# Patient Record
Sex: Female | Born: 1990 | Race: Black or African American | Hispanic: No | Marital: Single | Smoking: Current every day smoker
Health system: Southern US, Community
[De-identification: ages and names within clinical notes are randomized; demographics above are authoritative.]

## PROBLEM LIST (undated history)

## (undated) DIAGNOSIS — Z789 Other specified health status: Secondary | ICD-10-CM

## (undated) HISTORY — PX: NO PAST SURGERIES: SHX2092

## (undated) HISTORY — PX: DENTAL SURGERY: SHX609

---
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
## 2009-05-26 ENCOUNTER — Encounter: Payer: Self-pay | Admitting: Family Medicine

## 2009-06-01 ENCOUNTER — Encounter: Payer: Self-pay | Admitting: Family Medicine

## 2009-08-21 ENCOUNTER — Observation Stay: Payer: Self-pay | Admitting: Obstetrics and Gynecology

## 2009-09-02 ENCOUNTER — Observation Stay: Payer: Self-pay | Admitting: Obstetrics and Gynecology

## 2009-09-10 ENCOUNTER — Inpatient Hospital Stay: Payer: Self-pay

## 2009-09-21 ENCOUNTER — Emergency Department: Payer: Self-pay | Admitting: Emergency Medicine

## 2011-05-15 ENCOUNTER — Emergency Department: Payer: Self-pay | Admitting: Emergency Medicine

## 2011-08-11 ENCOUNTER — Emergency Department: Payer: Self-pay | Admitting: Emergency Medicine

## 2012-10-06 ENCOUNTER — Emergency Department: Payer: Self-pay | Admitting: Emergency Medicine

## 2012-10-06 LAB — URINALYSIS, COMPLETE
Bacteria: NONE SEEN
Bilirubin,UR: NEGATIVE
Glucose,UR: NEGATIVE mg/dL (ref 0–75)
Ketone: NEGATIVE
Nitrite: NEGATIVE
Ph: 5 (ref 4.5–8.0)
Protein: 30
RBC,UR: 109 /HPF (ref 0–5)
Specific Gravity: 1.032 (ref 1.003–1.030)
Squamous Epithelial: 11
WBC UR: 13 /HPF (ref 0–5)

## 2012-10-06 LAB — CBC
HCT: 37.7 % (ref 35.0–47.0)
HGB: 13 g/dL (ref 12.0–16.0)
MCH: 28.5 pg (ref 26.0–34.0)
MCHC: 34.5 g/dL (ref 32.0–36.0)
MCV: 83 fL (ref 80–100)
Platelet: 246 10*3/uL (ref 150–440)
RBC: 4.56 10*6/uL (ref 3.80–5.20)
RDW: 12.9 % (ref 11.5–14.5)
WBC: 10.5 10*3/uL (ref 3.6–11.0)

## 2012-10-06 LAB — HCG, QUANTITATIVE, PREGNANCY: Beta Hcg, Quant.: 10453 m[IU]/mL — ABNORMAL HIGH

## 2012-10-07 ENCOUNTER — Emergency Department: Payer: Self-pay | Admitting: Emergency Medicine

## 2012-10-07 LAB — CBC
HCT: 35.9 % (ref 35.0–47.0)
HGB: 12.5 g/dL (ref 12.0–16.0)
MCH: 28.6 pg (ref 26.0–34.0)
MCHC: 34.7 g/dL (ref 32.0–36.0)
MCV: 82 fL (ref 80–100)
Platelet: 238 10*3/uL (ref 150–440)
RBC: 4.36 10*6/uL (ref 3.80–5.20)
RDW: 12.7 % (ref 11.5–14.5)
WBC: 10.4 10*3/uL (ref 3.6–11.0)

## 2012-10-07 LAB — HCG, QUANTITATIVE, PREGNANCY: Beta Hcg, Quant.: 7074 m[IU]/mL — ABNORMAL HIGH

## 2013-07-31 ENCOUNTER — Emergency Department: Payer: Self-pay | Admitting: Emergency Medicine

## 2013-12-26 ENCOUNTER — Emergency Department: Payer: Self-pay | Admitting: Internal Medicine

## 2013-12-30 ENCOUNTER — Emergency Department: Payer: Self-pay | Admitting: Emergency Medicine

## 2014-08-31 ENCOUNTER — Emergency Department: Payer: Self-pay | Admitting: Emergency Medicine

## 2014-10-07 ENCOUNTER — Emergency Department: Payer: Self-pay | Admitting: Emergency Medicine

## 2014-12-18 ENCOUNTER — Emergency Department: Payer: Self-pay | Admitting: Emergency Medicine

## 2015-03-04 ENCOUNTER — Emergency Department
Admission: EM | Admit: 2015-03-04 | Discharge: 2015-03-04 | Disposition: A | Discharge status: Home / Self Care | Payer: Medicaid Other | Attending: Emergency Medicine | Admitting: Emergency Medicine

## 2015-03-04 ENCOUNTER — Encounter: Payer: Self-pay | Admitting: *Deleted

## 2015-03-04 DIAGNOSIS — M62838 Other muscle spasm: Secondary | ICD-10-CM | POA: Diagnosis not present

## 2015-03-04 DIAGNOSIS — Z720 Tobacco use: Secondary | ICD-10-CM | POA: Diagnosis not present

## 2015-03-04 DIAGNOSIS — R202 Paresthesia of skin: Secondary | ICD-10-CM | POA: Diagnosis not present

## 2015-03-04 DIAGNOSIS — M79602 Pain in left arm: Secondary | ICD-10-CM | POA: Diagnosis present

## 2015-03-04 MED ORDER — PREDNISONE 20 MG PO TABS
60.0000 mg | ORAL_TABLET | Freq: Once | ORAL | Status: AC
  Administered 2015-03-04: 60 mg via ORAL

## 2015-03-04 MED ORDER — PREDNISONE 20 MG PO TABS
ORAL_TABLET | ORAL | Status: DC
Start: 2015-03-04 — End: 2015-03-05
  Filled 2015-03-04: qty 3

## 2015-03-04 MED ORDER — PREDNISONE 10 MG (21) PO TBPK: ORAL_TABLET | ORAL | Status: DC

## 2015-03-04 MED ORDER — CYCLOBENZAPRINE HCL 10 MG PO TABS
ORAL_TABLET | ORAL | Status: AC
  Filled 2015-03-04: qty 1

## 2015-03-04 MED ORDER — CYCLOBENZAPRINE HCL 10 MG PO TABS: 10.0000 mg | ORAL_TABLET | Freq: Three times a day (TID) | ORAL | Status: AC | PRN

## 2015-03-04 MED ORDER — CYCLOBENZAPRINE HCL 10 MG PO TABS
5.0000 mg | ORAL_TABLET | Freq: Once | ORAL | Status: AC
  Administered 2015-03-04: 5 mg via ORAL

## 2015-03-04 NOTE — ED Provider Notes (Signed)
[facility] Emergency Department Provider Note    ____________________________________________  Time seen: 2045  I have reviewed the triage vital signs and the nursing notes.   HISTORY  Chief Complaint Arm Injury       HPI Mary Matthews is a 24 y.o. female numbness and tingling intermittently down her left arm particularly after work so she thought she slept longer pulled something is just here for evaluation starts in the shoulder goes down her left arm comes and goes after started 2 days ago denies any pain nothing makes it better or worse and no other symptoms currently no chest pain shortness of breath or any other neurological deficits     No past medical history on file.  There are no active problems to display for this patient.   No past surgical history on file.  Current Outpatient Rx  Name  Route  Sig  Dispense  Refill  . cyclobenzaprine (FLEXERIL) 10 MG tablet   Oral   Take 1 tablet (10 mg total) by mouth every 8 (eight) hours as needed for muscle spasms.   12 tablet   1   . predniSONE (STERAPRED UNI-PAK 21 TAB) 10 MG (21) TBPK tablet      Take 6 tablets on day 1 Take 5 tablets on day 2 Take 4 tablets on day 3 Take 3 tablets on day 4 Take 2 tablets on day 5 Take 1 tablet on day 6   21 tablet   0     Allergies Review of patient's allergies indicates no known allergies.  No family history on file.  Social History History  Substance Use Topics  . Smoking status: Current Every Day Smoker  . Smokeless tobacco: Not on file  . Alcohol Use: Yes    Review of Systems  Constitutional: Negative for fever. Eyes: Negative for visual changes. ENT: Negative for sore throat. Cardiovascular: Negative for chest pain. Respiratory: Negative for shortness of breath. Gastrointestinal: Negative for abdominal pain, vomiting and diarrhea. Genitourinary: Negative for dysuria. Musculoskeletal: Negative for back pain. Skin: Negative  for rash. Neurological: Negative for headaches, focal weakness or numbness.   10-point ROS otherwise negative.  ____________________________________________   PHYSICAL EXAM:  VITAL SIGNS: ED Triage Vitals  Enc Vitals Group     BP 03/04/15 1932 126/67 mmHg     Pulse Rate 03/04/15 1932 78     Resp 03/04/15 1932 17     Temp 03/04/15 1932 98.2 F (36.8 C)     Temp Source 03/04/15 1932 Oral     SpO2 03/04/15 1932 100 %     Weight 03/04/15 1932 185 lb (83.915 kg)     Height 03/04/15 1932 5\' 4"  (1.626 m)     Head Cir --      Peak Flow --      Pain Score 03/04/15 1933 8     Pain Loc --      Pain Edu? --      Excl. in GC? --      Constitutional: Alert and oriented. Well appearing and in no distress. Eyes: Conjunctivae are normal. PERRL. Normal extraocular movements. ENT   Head: Normocephalic and atraumatic.   Nose: No congestion/rhinnorhea.   Mouth/Throat: Mucous membranes are moist.   Neck: No stridor. Hematological/Lymphatic/Immunilogical: No cervical lymphadenopathy. Cardiovascular: Normal rate, regular rhythm. Normal and symmetric distal pulses are present in all extremities. No murmurs, rubs, or gallops. Respiratory: Normal respiratory effort without tachypnea nor retractions. Breath sounds are clear and equal bilaterally.  No wheezes/rales/rhonchi.  Musculoskeletal: Nontender with normal range of motion in all extremities. No joint effusions.  No lower extremity tenderness nor edema. Neurologic:  Normal speech and language. No gross focal neurologic deficits are appreciated. Speech is normal. No gait instability. Skin:  Skin is warm, dry and intact. No rash noted. Psychiatric: Mood and affect are normal. Speech and behavior are normal. Patient exhibits appropriate insight and judgment.  ____________________________________________       PROCEDURES  Procedure(s) performed: None  Critical Care performed:  No  ____________________________________________   INITIAL IMPRESSION / ASSESSMENT AND PLAN / ED COURSE  Pertinent labs & imaging results that were available during my care of the patient were reviewed by me and considered in my medical decision making (see chart for details).  Muscle spasm paresthesia will start patient on muscle relaxers and anti-inflammatories have a follow-up with orthopedics if symptoms persist return here for any acute concerns or worsening symptoms patient had an otherwise normal exam  ____________________________________________   FINAL CLINICAL IMPRESSION(S) / ED DIAGNOSES  Final diagnoses:  Paresthesia  Muscle spasm    Chinara Hertzberg Rosalyn Gess, PA-C 03/04/15 2111  Loleta Rose, MD 03/04/15 2330

## 2015-03-04 NOTE — ED Notes (Signed)
Pt with left upper arm numbness x 2 days. Pt states she thought she slept on it wrong but it does not seem to be getting better.

## 2015-11-20 ENCOUNTER — Emergency Department
Admission: EM | Admit: 2015-11-20 | Discharge: 2015-11-20 | Disposition: A | Discharge status: Home / Self Care | Payer: Self-pay | Attending: Student | Admitting: Student

## 2015-11-20 DIAGNOSIS — K029 Dental caries, unspecified: Secondary | ICD-10-CM | POA: Insufficient documentation

## 2015-11-20 DIAGNOSIS — F17200 Nicotine dependence, unspecified, uncomplicated: Secondary | ICD-10-CM | POA: Insufficient documentation

## 2015-11-20 DIAGNOSIS — K0889 Other specified disorders of teeth and supporting structures: Secondary | ICD-10-CM | POA: Insufficient documentation

## 2015-11-20 MED ORDER — AMOXICILLIN 500 MG PO CAPS: 500.0000 mg | ORAL_CAPSULE | Freq: Three times a day (TID) | ORAL | Status: DC

## 2015-11-20 MED ORDER — IBUPROFEN 800 MG PO TABS
800.0000 mg | ORAL_TABLET | Freq: Once | ORAL | Status: AC
Start: 2015-11-20 — End: 2015-11-20
  Administered 2015-11-20: 800 mg via ORAL
  Filled 2015-11-20: qty 1

## 2015-11-20 MED ORDER — TRAMADOL HCL 50 MG PO TABS: 50.0000 mg | ORAL_TABLET | Freq: Four times a day (QID) | ORAL | Status: DC | PRN

## 2015-11-20 MED ORDER — IBUPROFEN 800 MG PO TABS: 800.0000 mg | ORAL_TABLET | Freq: Three times a day (TID) | ORAL | Status: DC | PRN

## 2015-11-20 MED ORDER — TRAMADOL HCL 50 MG PO TABS
50.0000 mg | ORAL_TABLET | Freq: Once | ORAL | Status: AC
  Administered 2015-11-20: 50 mg via ORAL
  Filled 2015-11-20: qty 1

## 2015-11-20 MED ORDER — LIDOCAINE VISCOUS 2 % MT SOLN
15.0000 mL | Freq: Once | OROMUCOSAL | Status: AC
  Administered 2015-11-20: 15 mL via OROMUCOSAL
  Filled 2015-11-20: qty 15

## 2015-11-20 NOTE — ED Notes (Signed)
Pt in with co toothache

## 2015-11-20 NOTE — ED Notes (Signed)
AAOx3.  Skin warm and dry.  NAD 

## 2015-11-20 NOTE — Discharge Instructions (Signed)
Follow-up from the list of dental clinics provided. °OPTIONS FOR DENTAL FOLLOW UP CARE ° °[place] Department of Health and Human Services - Local Safety Net Dental Clinics °http://www.ncdhhs.gov/dph/oralhealth/services/safetynetclinics.htm °  °[facility] (336-562-3123) ° °[facility] [place] ([phone]) ° °[facility] ([phone] ext 237) ° °[facility] ([phone]) ° °[facility] ([phone]) °This clinic caters to the indigent population and is on a lottery system. °Location: °[facility], [address], [place] °Clinic Hours: °Wednesdays from 6pm - 9pm, patients seen by a lottery system. °For dates, call or go to [email or web address].[facility].edu/[facility]/patients/Dental-[facility] °Services: °Cleanings, fillings and simple extractions. °Payment Options: °DENTAL WORK IS FREE OF CHARGE. Bring proof of income or support. °Best way to get seen: °Arrive at 5:15 pm - this is a lottery, NOT first come/first serve, so arriving earlier will not increase your chances of being seen. °  °  °[facility] °[phone] °Select option 1 for emergencies °  °Location: °[facility], [address], [place] °Clinic Hours: °No walk-ins accepted - call the day before to schedule an appointment. °Check in times are 9:30 am and 1:30 pm. °Services: °Simple extractions, temporary fillings, pulpectomy/pulp debridement, uncomplicated abscess drainage. °Payment Options: °PAYMENT IS DUE AT THE TIME OF SERVICE.  Fee is usually $100-200, additional surgical procedures (e.g. abscess drainage) may be extra. °Cash, checks, Visa/MasterCard accepted.  Can file Medicaid if patient is covered for dental - patient should call case worker to check. °No discount for [facility] patients. °Best way to get seen: °MUST call the day before and get onto the schedule. Can usually be seen the next 1-2 days. No walk-ins accepted. °  °   °[facility] °[phone] °  °Location: °[facility], [address], [place] °Clinic Hours: °M, W, Th, F 8am or 1:30pm, Tues 9a or 1:30 - first come/first served. °Services: °Simple extractions, temporary fillings, uncomplicated abscess drainage.  You do not need to be an [place] resident. °Payment Options: °PAYMENT IS DUE AT THE TIME OF SERVICE. °Dental insurance, otherwise sliding scale - bring proof of income or support. °Depending on income and treatment needed, cost is usually $50-200. °Best way to get seen: °Arrive early as it is first come/first served. °  °  °[facility] °[phone] °  °Location: °[address] °Clinic Hours: °Mon-Thu 8a-5p °Services: °Most basic dental services including extractions and fillings. °Payment Options: °PAYMENT IS DUE AT THE TIME OF SERVICE. °Sliding scale, up to 50% off - bring proof if income or support. °Medicaid with dental option accepted. °Best way to get seen: °Call to schedule an appointment, can usually be seen within 2 weeks OR they will try to see walk-ins - show up at 8a or 2p (you may have to wait). °  °  °[facility] °[phone] °[place] RESIDENTS ONLY °  °Location: °[facility], [address], [place], [place] [address] °Clinic Hours: By appointment only. °Monday - Thursday 8am-5pm, Friday 8am-12pm °Services: Cleanings, fillings, extractions. °Payment Options: °PAYMENT IS DUE AT THE TIME OF SERVICE. °Cash, Visa or MasterCard. Sliding scale - $30 minimum per service. °Best way to get seen: °Come in to office, complete packet and make an appointment - need proof of income °or support monies for each household member and proof of [place] residence. °Usually takes about a month to get in. °  °  °[facility] °[phone] °  °Location: °[address] [place] °Clinic Hours: Walk-in Urgent Care  Dental Services are offered Monday-Friday mornings only. °The numbers of emergencies accepted daily is limited to the number of °providers available. °Maximum 15 - Mondays, Wednesdays & Thursdays °Maximum 10 - Tuesdays & Fridays °Services: °You do not need to be a [place] resident to be seen for a dental emergency. °Emergencies are defined as pain, swelling, abnormal bleeding, or dental trauma. Walkins will receive x-rays if needed. °NOTE: Dental cleaning is not an emergency. °Payment Options: °PAYMENT IS DUE AT THE TIME OF SERVICE. °Minimum co-pay is $40.00 for uninsured patients. °Minimum co-pay is $3.00 for Medicaid with dental coverage. °Dental Insurance is accepted and must be presented at time of visit. °Medicare does not cover dental. °Forms of payment: Cash, credit card, checks. °Best way to get seen: °If not previously registered with the clinic, walk-in dental registration begins at 7:15 am and is on a first come/first serve basis. °If previously registered with the clinic, call to make an appointment. °  °  °[facility] °[phone] °[place] RESIDENTS ONLY °  °Location: °[address], [place], [place] °Clinic Hours: °Mon-Thu 10a-2p °Services: Extractions only! °Payment Options: °FREE (donations accepted) - bring proof of income or support °Best way to get seen: °Call and schedule an appointment OR come at 8am on the 1st Monday of every month (except for holidays) when it is first come/first served. °  °  °[facility] °[phone] °  °Location: °[address], [place] °Clinic Hours: °Friday mornings °Services, Payment Options, Best way to get seen: °Call for info ° °

## 2015-11-20 NOTE — ED Provider Notes (Signed)
[facility] Emergency Department Provider Note  ____________________________________________  Time seen: Approximately 9:54 PM  I have reviewed the triage vital signs and the nursing notes.   HISTORY  Chief Complaint Dental Pain    HPI Mary Matthews is a 25 y.o. female patient complaining of onset of dental pain approximately 2 hours ago. Patient state filling in the left upper molar fell out while chewing gum. Patient denies any fever or swelling secondary to his complaint. Patient is rating the pain as a 10 over 10. Patient is describing the pain as sharp. No palliative measures taken for this complaint.   No past medical history on file.  There are no active problems to display for this patient.   No past surgical history on file.  Current Outpatient Rx  Name  Route  Sig  Dispense  Refill  . amoxicillin (AMOXIL) 500 MG capsule   Oral   Take 1 capsule (500 mg total) by mouth 3 (three) times daily.   30 capsule   0   . cyclobenzaprine (FLEXERIL) 10 MG tablet   Oral   Take 1 tablet (10 mg total) by mouth every 8 (eight) hours as needed for muscle spasms.   12 tablet   1   . ibuprofen (ADVIL,MOTRIN) 800 MG tablet   Oral   Take 1 tablet (800 mg total) by mouth every 8 (eight) hours as needed.   30 tablet   0   . predniSONE (STERAPRED UNI-PAK 21 TAB) 10 MG (21) TBPK tablet      Take 6 tablets on day 1 Take 5 tablets on day 2 Take 4 tablets on day 3 Take 3 tablets on day 4 Take 2 tablets on day 5 Take 1 tablet on day 6   21 tablet   0   . traMADol (ULTRAM) 50 MG tablet   Oral   Take 1 tablet (50 mg total) by mouth every 6 (six) hours as needed.   20 tablet   0     Allergies Review of patient's allergies indicates no known allergies.  No family history on file.  Social History Social History  Substance Use Topics  . Smoking status: Current Every Day Smoker  . Smokeless tobacco: Not on file  . Alcohol Use: Yes    Review  of Systems Constitutional: No fever/chills Eyes: No visual changes. ENT: No sore throat. Dental painCardiovascular: Denies chest pain. Respiratory: Denies shortness of breath. Gastrointestinal: No abdominal pain.  No nausea, no vomiting.  No diarrhea.  No constipation. Genitourinary: Negative for dysuria. Musculoskeletal: Negative for back pain. Skin: Negative for rash. Neurological: Negative for headaches, focal weakness or numbness. 10-point ROS otherwise negative.  ____________________________________________   PHYSICAL EXAM:  VITAL SIGNS: ED Triage Vitals  Enc Vitals Group     BP 11/20/15 2114 110/95 mmHg     Pulse Rate 11/20/15 2114 87     Resp 11/20/15 2114 20     Temp 11/20/15 2114 98.3 F (36.8 C)     Temp Source 11/20/15 2114 Oral     SpO2 11/20/15 2114 98 %     Weight 11/20/15 2114 183 lb (83.008 kg)     Height 11/20/15 2114  (1.651 m)     Head Cir --      Peak Flow --      Pain Score 11/20/15 2117 10     Pain Loc --      Pain Edu? --      Excl. in  GC? --     Constitutional: Alert and oriented. Well appearing and in no acute distress. Eyes: Conjunctivae are normal. PERRL. EOMI. Head: Atraumatic. Nose: No congestion/rhinnorhea. Mouth/Throat: Mucous membranes are moist.  Oropharynx non-erythematous. Cavity tooth #14 Neck: No stridor.  No cervical spine tenderness to palpation. Hematological/Lymphatic/Immunilogical: No cervical lymphadenopathy. Cardiovascular: Normal rate, regular rhythm. Grossly normal heart sounds.  Good peripheral circulation. Respiratory: Normal respiratory effort.  No retractions. Lungs CTAB. Gastrointestinal: Soft and nontender. No distention. No abdominal bruits. No CVA tenderness. Musculoskeletal: No lower extremity tenderness nor edema.  No joint effusions. Neurologic:  Normal speech and language. No gross focal neurologic deficits are appreciated. No gait instability. Skin:  Skin is warm, dry and intact. No rash  noted. Psychiatric: Mood and affect are normal. Speech and behavior are normal.  ____________________________________________   LABS (all labs ordered are listed, but only abnormal results are displayed)  Labs Reviewed - No data to display ____________________________________________  EKG   ____________________________________________  RADIOLOGY   ____________________________________________   PROCEDURES  Procedure(s) performed: None  Critical Care performed: No  ____________________________________________   INITIAL IMPRESSION / ASSESSMENT AND PLAN / ED COURSE  Pertinent labs & imaging results that were available during my care of the patient were reviewed by me and considered in my medical decision making (see chart for details).  The pain secondary to cavity. Patient given a list of dental clinics for follow-up care. Patient given a prescription for amoxicillin, ibuprofen, tramadol. ____________________________________________   FINAL CLINICAL IMPRESSION(S) / ED DIAGNOSES  Final diagnoses:  Pain due to dental caries      Joni Reining, PA-C 11/20/15 2204  Gayla Doss, MD 11/22/15 [phone]

## 2016-04-19 ENCOUNTER — Emergency Department
Admission: EM | Admit: 2016-04-19 | Discharge: 2016-04-19 | Disposition: A | Discharge status: Home / Self Care | Payer: Medicaid Other | Attending: Emergency Medicine | Admitting: Emergency Medicine

## 2016-04-19 DIAGNOSIS — F17200 Nicotine dependence, unspecified, uncomplicated: Secondary | ICD-10-CM | POA: Diagnosis not present

## 2016-04-19 DIAGNOSIS — K0889 Other specified disorders of teeth and supporting structures: Secondary | ICD-10-CM

## 2016-04-19 DIAGNOSIS — J039 Acute tonsillitis, unspecified: Secondary | ICD-10-CM | POA: Diagnosis not present

## 2016-04-19 DIAGNOSIS — J029 Acute pharyngitis, unspecified: Secondary | ICD-10-CM | POA: Diagnosis present

## 2016-04-19 LAB — POCT RAPID STREP A: Streptococcus, Group A Screen (Direct): NEGATIVE

## 2016-04-19 MED ORDER — AMOXICILLIN 875 MG PO TABS: 875.0000 mg | ORAL_TABLET | Freq: Two times a day (BID) | ORAL | Status: DC

## 2016-04-19 MED ORDER — FIRST-DUKES MOUTHWASH MT SUSP: 5.0000 mL | Freq: Four times a day (QID) | OROMUCOSAL | Status: DC | PRN

## 2016-04-19 NOTE — ED Notes (Signed)
Pt c/o sore  Throat for the past week..Marland Kitchen

## 2016-04-19 NOTE — ED Provider Notes (Signed)
[facility] Emergency Department Provider Note  ____________________________________________  Time seen: Approximately 9:54 AM  I have reviewed the triage vital signs and the nursing notes.   HISTORY  Chief Complaint Sore Throat    HPI Mary Matthews is a 25 y.o. female , NAD, presents to the emergency with 1 week history of sore throat. States she's had a sore throat and pain with swallowing for approximately one week. Has not taken anything over-the-counter for her symptoms. Has not had any nasal congestion, runny nose, sinus pressure. States that she has some left upper tooth pain that can radiate into the left ear but has not seen a dentist in regards to such. Denies any fevers, chills, body aches. Has not had any abdominal pain, nausea, vomiting, diarrhea.   History reviewed. No pertinent past medical history.  There are no active problems to display for this patient.   History reviewed. No pertinent past surgical history.  Current Outpatient Rx  Name  Route  Sig  Dispense  Refill  . amoxicillin (AMOXIL) 875 MG tablet   Oral   Take 1 tablet (875 mg total) by mouth 2 (two) times daily.   20 tablet   0   . Diphenhyd-Hydrocort-Nystatin (FIRST-DUKES MOUTHWASH) SUSP   Mouth/Throat   Use as directed 5 mLs in the mouth or throat 4 (four) times daily as needed (for throat pain).   120 mL   0     Allergies Review of patient's allergies indicates no known allergies.  No family history on file.  Social History Social History  Substance Use Topics  . Smoking status: Current Every Day Smoker  . Smokeless tobacco: None  . Alcohol Use: Yes     Review of Systems  Constitutional: No fever/chills ENT: Positive sore throat, left elbow pain, left ear pain. No sore throat, nasal congestion, runny nose, sinus pressure Cardiovascular: No chest pain. Respiratory: No cough, chest congestion. No shortness of breath. No wheezing.  Gastrointestinal: No  abdominal pain.  No nausea, vomiting.  No diarrhea.   Musculoskeletal: Negative for neck pain nor general myalgias.  Skin: Negative for rash. Neurological: Negative for headaches, focal weakness or numbness. 10-point ROS otherwise negative.  ____________________________________________   PHYSICAL EXAM:  VITAL SIGNS: ED Triage Vitals  Enc Vitals Group     BP 04/19/16 0858 129/64 mmHg     Pulse Rate 04/19/16 0858 69     Resp 04/19/16 0858 18     Temp 04/19/16 0858 98.7 F (37.1 C)     Temp Source 04/19/16 0858 Oral     SpO2 04/19/16 0858 100 %     Weight 04/19/16 0858 180 lb (81.647 kg)     Height 04/19/16 0858 5\' 4"  (1.626 m)     Head Cir --      Peak Flow --      Pain Score 04/19/16 0856 5     Pain Loc --      Pain Edu? --      Excl. in GC? --      Constitutional: Alert and oriented. Well appearing and in no acute distress. Eyes: Conjunctivae are normal.  Head: Atraumatic. ENT:      Ears: TMs visualized bilaterally without effusion, erythema, bulging, perforation.      Nose: No congestion/rhinnorhea.      Mouth/Throat: Left tonsil with mild swelling and exudate. Pharynx is injected but there is no swelling in the posterior pharynx. Uvula is midline. Mucous membranes are moist.  Neck: Supple  with full range of motion. Hematological/Lymphatic/Immunilogical: No cervical lymphadenopathy. Cardiovascular: Normal rate, regular rhythm. Normal S1 and S2.   Respiratory: Normal respiratory effort without tachypnea or retractions. Lungs CTAB with breath sounds noted in all lung fields. Neurologic:  Normal speech and language. No gross focal neurologic deficits are appreciated.  Skin:  Skin is warm, dry and intact. No rash noted. Psychiatric: Mood and affect are normal. Speech and behavior are normal. Patient exhibits appropriate insight and judgement.   ____________________________________________   LABS (all labs ordered are listed, but only abnormal results are  displayed)  Labs Reviewed  POCT RAPID STREP A   ____________________________________________  EKG  None ____________________________________________  RADIOLOGY  None ____________________________________________    PROCEDURES  Procedure(s) performed: None   Medications - No data to display   ____________________________________________   INITIAL IMPRESSION / ASSESSMENT AND PLAN / ED COURSE  Pertinent lab results that were available during my care of the patient were reviewed by me and considered in my medical decision making (see chart for details).  Patient's diagnosis is consistent with acute tonsillitis and tooth pain. Patient will be discharged home with prescriptions for amoxicillin and first Dukes mouthwash to take as directed. Patient may take over-the-counter Tylenol or ibuprofen as needed for pain. Patient is to follow up with her primary care provider at [facility] as well as the dental clinic in [place] if symptoms persist past this treatment course. Patient is given ED precautions to return to the ED for any worsening or new symptoms.    ____________________________________________  FINAL CLINICAL IMPRESSION(S) / ED DIAGNOSES  Final diagnoses:  Tonsillitis  Tooth pain      NEW MEDICATIONS STARTED DURING THIS VISIT:  Discharge Medication List as of 04/19/2016  9:59 AM    START taking these medications   Details  amoxicillin (AMOXIL) 875 MG tablet Take 1 tablet (875 mg total) by mouth 2 (two) times daily., Starting 04/19/2016, Until Discontinued, Print    Diphenhyd-Hydrocort-Nystatin (FIRST-DUKES MOUTHWASH) SUSP Use as directed 5 mLs in the mouth or throat 4 (four) times daily as needed (for throat pain)., Starting 04/19/2016, Until Discontinued, Print             Hope Pigeon, PA-C 04/19/16 1140  Myrna Blazer, MD 04/19/16 1539

## 2016-04-19 NOTE — ED Notes (Signed)
States she developed runny nose and sore throat after going swimming last Thursday  Also states she is having tooth pain to left side .  States she has a cavity there but pain now radiates into left ear

## 2016-04-19 NOTE — Discharge Instructions (Signed)
Tonsillitis °Tonsillitis is an infection of the throat. This infection causes the tonsils to become red, tender, and puffy (swollen). Tonsils are groups of tissue at the back of your throat. If bacteria caused your infection, antibiotic medicine will be given to you. Sometimes symptoms of tonsillitis can be relieved with the use of steroid medicine. If your tonsillitis is severe and happens often, you may need to get your tonsils removed (tonsillectomy). °HOME CARE  °· Rest and sleep often. °· Drink enough fluids to keep your pee (urine) clear or pale yellow. °· While your throat is sore, eat soft or liquid foods like: °¨ Soup. °¨ Ice cream. °¨ Instant breakfast drinks. °· Eat frozen ice pops. °· Gargle with a warm or cold liquid to help soothe the throat. Gargle with a water and salt mix. Mix 1/4 teaspoon of salt and 1/4 teaspoon of baking soda in 1 cup of water. °· Only take medicines as told by your doctor. °· If you are given medicines (antibiotics), take them as told. Finish them even if you start to feel better. °GET HELP IF: °· You have large, tender lumps in your neck. °· You have a rash. °· You cough up green, yellow-brown, or bloody fluid. °· You cannot swallow liquids or food for 24 hours. °· You notice that only one of your tonsils is swollen. °GET HELP RIGHT AWAY IF:  °· You throw up (vomit). °· You have a very bad headache. °· You have a stiff neck. °· You have chest pain. °· You have trouble breathing or swallowing. °· You have bad throat pain, drooling, or your voice changes. °· You have bad pain not helped by medicine. °· You cannot fully open your mouth. °· You have redness, puffiness, or bad pain in the neck. °· You have a fever. °MAKE SURE YOU:  °· Understand these instructions. °· Will watch your condition. °· Will get help right away if you are not doing well or get worse. °  °This information is not intended to replace advice given to you by your health care provider. Make sure you discuss any  questions you have with your health care provider. °  °Document Released: 04/05/2008 Document Revised: 10/23/2013 Document Reviewed: 04/06/2013 °Elsevier Interactive Patient Education ©2016 [employer] ° °

## 2016-06-15 ENCOUNTER — Emergency Department: Payer: Medicaid Other

## 2016-06-15 ENCOUNTER — Emergency Department
Admission: EM | Admit: 2016-06-15 | Discharge: 2016-06-15 | Disposition: A | Discharge status: Home / Self Care | Payer: Medicaid Other | Attending: Emergency Medicine | Admitting: Emergency Medicine

## 2016-06-15 DIAGNOSIS — F17200 Nicotine dependence, unspecified, uncomplicated: Secondary | ICD-10-CM | POA: Insufficient documentation

## 2016-06-15 DIAGNOSIS — S20219A Contusion of unspecified front wall of thorax, initial encounter: Secondary | ICD-10-CM | POA: Diagnosis not present

## 2016-06-15 DIAGNOSIS — Y9301 Activity, walking, marching and hiking: Secondary | ICD-10-CM | POA: Insufficient documentation

## 2016-06-15 DIAGNOSIS — Y999 Unspecified external cause status: Secondary | ICD-10-CM | POA: Insufficient documentation

## 2016-06-15 DIAGNOSIS — S300XXA Contusion of lower back and pelvis, initial encounter: Secondary | ICD-10-CM | POA: Diagnosis not present

## 2016-06-15 DIAGNOSIS — S20229A Contusion of unspecified back wall of thorax, initial encounter: Secondary | ICD-10-CM

## 2016-06-15 DIAGNOSIS — W108XXA Fall (on) (from) other stairs and steps, initial encounter: Secondary | ICD-10-CM | POA: Insufficient documentation

## 2016-06-15 DIAGNOSIS — Y929 Unspecified place or not applicable: Secondary | ICD-10-CM | POA: Insufficient documentation

## 2016-06-15 DIAGNOSIS — S299XXA Unspecified injury of thorax, initial encounter: Secondary | ICD-10-CM | POA: Diagnosis present

## 2016-06-15 LAB — POCT PREGNANCY, URINE: Preg Test, Ur: NEGATIVE

## 2016-06-15 MED ORDER — NAPROXEN 500 MG PO TABS: 500.0000 mg | ORAL_TABLET | Freq: Two times a day (BID) | ORAL | 0 refills | Status: DC

## 2016-06-15 MED ORDER — HYDROCODONE-ACETAMINOPHEN 5-325 MG PO TABS: 1.0000 | ORAL_TABLET | ORAL | 0 refills | Status: DC | PRN

## 2016-06-15 NOTE — ED Provider Notes (Signed)
[facility] Emergency Department Provider Note  ____________________________________________   First MD Initiated Contact with Patient 06/15/16 [phone]     (approximate)  I have reviewed the triage vital signs and the nursing notes.   HISTORY  Chief Complaint Back Pain    HPI Mary Matthews is a 25 y.o. female is here with complaint of both upper and lower back pain after slipping on some concrete steps that were wet Saturday. Patient denies any head injury or loss of consciousness during this event. Patient denies any visual changes since this happened. Patient is take an over-the-counter Advil infrequently for her back pain. She denies any hematuria. She continues this morning with stiffness and pain with walking. She denies any previous back problems. Currently patient rates her pain as an 8 out of 10. Patient has continued to be ambulatory without assistance since her accident.   History reviewed. No pertinent past medical history.  There are no active problems to display for this patient.   History reviewed. No pertinent surgical history.  Prior to Admission medications   Medication Sig Start Date End Date Taking? Authorizing Provider  HYDROcodone-acetaminophen (NORCO/VICODIN) 5-325 MG tablet Take 1 tablet by mouth every 4 (four) hours as needed for moderate pain. 06/15/16   Tommi Rumpshonda L Naly Schwanz, PA-C  naproxen (NAPROSYN) 500 MG tablet Take 1 tablet (500 mg total) by mouth 2 (two) times daily with a meal. 06/15/16   Tommi Rumpshonda L Adabelle Griffiths, PA-C    Allergies Review of patient's allergies indicates no known allergies.  No family history on file.  Social History Social History  Substance Use Topics  . Smoking status: Current Every Day Smoker  . Smokeless tobacco: Never Used  . Alcohol use Yes    Review of Systems Constitutional: No fever/chills Eyes: No visual changes. ENT: No trauma Cardiovascular: Denies chest pain. Respiratory: Denies shortness of  breath. Gastrointestinal: No abdominal pain.  No nausea, no vomiting.   Genitourinary: Negative for dysuria. Musculoskeletal: Positive for thoracic and lumbosacral pain. Skin: Negative for rash. Neurological: Negative for headaches, focal weakness or numbness.  10-point ROS otherwise negative.  ____________________________________________   PHYSICAL EXAM:  VITAL SIGNS: ED Triage Vitals  Enc Vitals Group     BP 06/15/16 0852 128/77     Pulse Rate 06/15/16 0852 71     Resp 06/15/16 0852 18     Temp 06/15/16 0852 97.8 F (36.6 C)     Temp Source 06/15/16 0852 Oral     SpO2 06/15/16 0852 98 %     Weight 06/15/16 0853 175 lb (79.4 kg)     Height 06/15/16 0853 5\' 4"  (1.626 m)     Head Circumference --      Peak Flow --      Pain Score 06/15/16 0853 8     Pain Loc --      Pain Edu? --      Excl. in GC? --     Constitutional: Alert and oriented. Well appearing and in no acute distress. Eyes: Conjunctivae are normal. PERRL. EOMI. Head: Atraumatic. Nose: No congestion/rhinnorhea. Neck: No stridor. No cervical tenderness on palpation posteriorly. Range of motion is within normal limits without any restriction or discomfort. Hematological/Lymphatic/Immunilogical: No cervical lymphadenopathy. Cardiovascular: Normal rate, regular rhythm. Grossly normal heart sounds.  Good peripheral circulation. Respiratory: Normal respiratory effort.  No retractions. Lungs CTAB. Gastrointestinal: Soft and nontender. No distention. No CVA tenderness. Bowel sounds are normoactive 4 quadrants. Musculoskeletal: Moves upper and lower extremities without any difficulty.  Normal gait was noted. There is some tenderness on palpation of the thoracic spine and lumbosacral spine without paravertebral muscle tenderness. No range of motion difficulties were seen and no muscle spasms. Straight leg raises were -70 bilaterally. Neurologic:  Normal speech and language. No gross focal neurologic deficits are  appreciated. No gait instability. Reflexes are equal at 2+ bilaterally. Skin:  Skin is warm, dry and intact. No rash noted. Psychiatric: Mood and affect are normal. Speech and behavior are normal.  ____________________________________________   LABS (all labs ordered are listed, but only abnormal results are displayed)  Labs Reviewed  POC URINE PREG, ED  POCT PREGNANCY, URINE    RADIOLOGY  Lumbar spine x-ray per radiologist is negative for fracture. Dressings spine x-ray per radiologist is negative for fracture. I, Tommi Rumpshonda L Hayden Mabin, personally viewed and evaluated these images (plain radiographs) as part of my medical decision making, as well as reviewing the written report by the radiologist. ____________________________________________   PROCEDURES  Procedure(s) performed: None  Procedures  Critical Care performed: No  ____________________________________________   INITIAL IMPRESSION / ASSESSMENT AND PLAN / ED COURSE  Pertinent labs & imaging results that were available during my care of the patient were reviewed by me and considered in my medical decision making (see chart for details).    Clinical Course   Patient was informed that her x-rays were within normal limits and that she did not have any fractures. Patient is given a prescription for naproxen 500 mg twice a day with food and Norco one every 4 hours for severe pain. Patient is encouraged to use ice or heat to her back and follow-up with her primary care doctor or [facility] if any continued problems.  ____________________________________________   FINAL CLINICAL IMPRESSION(S) / ED DIAGNOSES  Final diagnoses:  Contusion of lower back, initial encounter  Contusion of upper back, unspecified laterality, initial encounter      NEW MEDICATIONS STARTED DURING THIS VISIT:  Discharge Medication List as of 06/15/2016 11:10 AM    START taking these medications   Details  HYDROcodone-acetaminophen  (NORCO/VICODIN) 5-325 MG tablet Take 1 tablet by mouth every 4 (four) hours as needed for moderate pain., Starting Tue 06/15/2016, Print    naproxen (NAPROSYN) 500 MG tablet Take 1 tablet (500 mg total) by mouth 2 (two) times daily with a meal., Starting Tue 06/15/2016, Print         Note:  This document was prepared using Dragon voice recognition software and may include unintentional dictation errors.    Tommi RumpsRhonda L Son Barkan, PA-C 06/15/16 1201    Sharman CheekPhillip Stafford, MD 06/15/16 [phone]

## 2016-06-15 NOTE — ED Notes (Signed)
See triage note   States she was walking down steps.  ankle rolled and she fell  Having pain to entire back  Ambulates well to treatment area

## 2016-06-15 NOTE — ED Triage Notes (Signed)
Pt states she slipped on steps Saturday and has been having lower back -pain since..Marland Kitchen

## 2016-06-15 NOTE — Discharge Instructions (Signed)
Follow-up with [facility] if any continued problems. Naproxen 500 mg twice a day with food. This is both for pain and inflammation. Norco if needed for severe pain. The aware that he cannot drive and take this medication.

## 2017-04-29 ENCOUNTER — Encounter: Payer: Self-pay | Admitting: Emergency Medicine

## 2017-04-29 ENCOUNTER — Emergency Department
Admission: EM | Admit: 2017-04-29 | Discharge: 2017-04-29 | Disposition: A | Discharge status: Home / Self Care | Payer: Medicaid Other | Attending: Emergency Medicine | Admitting: Emergency Medicine

## 2017-04-29 DIAGNOSIS — T7840XA Allergy, unspecified, initial encounter: Secondary | ICD-10-CM | POA: Insufficient documentation

## 2017-04-29 DIAGNOSIS — K029 Dental caries, unspecified: Secondary | ICD-10-CM | POA: Insufficient documentation

## 2017-04-29 DIAGNOSIS — F17200 Nicotine dependence, unspecified, uncomplicated: Secondary | ICD-10-CM | POA: Insufficient documentation

## 2017-04-29 MED ORDER — FAMOTIDINE 20 MG PO TABS: 20.0000 mg | ORAL_TABLET | Freq: Two times a day (BID) | ORAL | 0 refills | Status: DC

## 2017-04-29 MED ORDER — PREDNISONE 10 MG PO TABS: 10.0000 mg | ORAL_TABLET | Freq: Every day | ORAL | 0 refills | Status: DC

## 2017-04-29 MED ORDER — CLINDAMYCIN HCL 150 MG PO CAPS
ORAL_CAPSULE | ORAL | Status: AC
  Filled 2017-04-29: qty 1

## 2017-04-29 MED ORDER — METHYLPREDNISOLONE SODIUM SUCC 125 MG IJ SOLR
125.0000 mg | Freq: Once | INTRAMUSCULAR | Status: AC
  Administered 2017-04-29: 125 mg via INTRAMUSCULAR
  Filled 2017-04-29: qty 2

## 2017-04-29 MED ORDER — CLINDAMYCIN HCL 150 MG PO CAPS
300.0000 mg | ORAL_CAPSULE | Freq: Once | ORAL | Status: AC
  Administered 2017-04-29: 300 mg via ORAL
  Filled 2017-04-29: qty 2

## 2017-04-29 MED ORDER — DIPHENHYDRAMINE HCL 50 MG/ML IJ SOLN: 25.0000 mg | Freq: Once | INTRAMUSCULAR | Status: DC

## 2017-04-29 MED ORDER — CLINDAMYCIN PHOSPHATE 300 MG/2ML IJ SOLN
INTRAMUSCULAR | Status: AC
  Filled 2017-04-29: qty 2

## 2017-04-29 MED ORDER — FAMOTIDINE 20 MG PO TABS
20.0000 mg | ORAL_TABLET | Freq: Once | ORAL | Status: AC
  Administered 2017-04-29: 20 mg via ORAL
  Filled 2017-04-29: qty 1

## 2017-04-29 MED ORDER — CLINDAMYCIN HCL 300 MG PO CAPS: 300.0000 mg | ORAL_CAPSULE | Freq: Three times a day (TID) | ORAL | 0 refills | Status: AC

## 2017-04-29 MED ORDER — DIPHENHYDRAMINE HCL 50 MG/ML IJ SOLN
25.0000 mg | Freq: Once | INTRAMUSCULAR | Status: AC
  Administered 2017-04-29: 25 mg via INTRAMUSCULAR
  Filled 2017-04-29: qty 1

## 2017-04-29 NOTE — ED Triage Notes (Signed)
Pt to ED via POV c/o left arm pain and swelling. Pt states that it is a burning pain, thinks that something may have bit her arm. Pt states that she just noticed symptoms this morning. No redness noted. Pt in NAD at this time.

## 2017-04-29 NOTE — ED Provider Notes (Signed)
[facility] Emergency Department Provider Note  ____________________________________________  Time seen: Approximately 10:30 AM  I have reviewed the triage vital signs and the nursing notes.   HISTORY  Chief Complaint Arm Pain    HPI Mary Matthews is a 26 y.o. female that presents to the emergency department with 1 day of arm swelling, stinging and burning. She woke up this morning with symptoms. She states that her arm is itchy. She has had a similar reaction to spider bite in the past. She also has a large cavity in her upper left molar. She has been unable to see a dentist because her insurance does not start until September. She went to the sliding scale clinic in [place] but was late to her appointment and they would not see her. She has noticed bloody and white drainage coming from tooth. No difficulty opening and closing mouth. No swelling. No alleviating measures have been attempted.  No allergies. No IV drug use. Her mother drove her to the ED. She denies fever, chills, SOB, nausea, vomiting.   History reviewed. No pertinent past medical history.  There are no active problems to display for this patient.   History reviewed. No pertinent surgical history.  Prior to Admission medications   Medication Sig Start Date End Date Taking? Authorizing Provider  clindamycin (CLEOCIN) 300 MG capsule Take 1 capsule (300 mg total) by mouth 3 (three) times daily. 04/29/17 05/09/17  Enid DerryWagner, Dutchess Crosland, PA-C  famotidine (PEPCID) 20 MG tablet Take 1 tablet (20 mg total) by mouth 2 (two) times daily. 04/29/17 04/29/18  Enid DerryWagner, Taitum Alms, PA-C  HYDROcodone-acetaminophen (NORCO/VICODIN) 5-325 MG tablet Take 1 tablet by mouth every 4 (four) hours as needed for moderate pain. 06/15/16   Tommi RumpsSummers, Rhonda L, PA-C  naproxen (NAPROSYN) 500 MG tablet Take 1 tablet (500 mg total) by mouth 2 (two) times daily with a meal. 06/15/16   Tommi RumpsSummers, Rhonda L, PA-C  predniSONE (DELTASONE) 10 MG tablet  Take 1 tablet (10 mg total) by mouth daily. 04/29/17   Enid DerryWagner, Ariann Khaimov, PA-C    Allergies Patient has no known allergies.  No family history on file.  Social History Social History  Substance Use Topics  . Smoking status: Current Every Day Smoker  . Smokeless tobacco: Never Used  . Alcohol use Yes     Review of Systems  Constitutional: No fever/chills Cardiovascular: No chest pain. Respiratory: No SOB. Gastrointestinal: No abdominal pain.  No nausea, no vomiting.  Skin: Negative for abrasions, lacerations, ecchymosis. Neurological: Negative for headaches   ____________________________________________   PHYSICAL EXAM:  VITAL SIGNS: ED Triage Vitals [04/29/17 0912]  Enc Vitals Group     BP 124/68     Pulse Rate 79     Resp 16     Temp 98.7 F (37.1 C)     Temp Source Oral     SpO2 100 %     Weight 175 lb (79.4 kg)     Height      Head Circumference      Peak Flow      Pain Score 7     Pain Loc      Pain Edu?      Excl. in GC?      Constitutional: Alert and oriented. Well appearing and in no acute distress. Eyes: Conjunctivae are normal. PERRL. EOMI. Head: Atraumatic. ENT:      Ears:      Nose: No congestion/rhinnorhea.      Mouth/Throat: Mucous membranes are moist. Large  cavity in upper left molar. No visible drainage. No tenderness to palpation. No TMJ pain. No swelling. Neck: No stridor.  Cardiovascular: Normal rate, regular rhythm.  Good peripheral circulation. 2+ radial pulses. Respiratory: Normal respiratory effort without tachypnea or retractions. Lungs CTAB. Good air entry to the bases with no decreased or absent breath sounds. Musculoskeletal: Full range of motion to all extremities. No gross deformities appreciated. Neurologic:  Normal speech and language. No gross focal neurologic deficits are appreciated.  Skin:  Skin is warm, dry and intact. 2 inches of erythema, swelling, and heat to left forearm. No visible bite or wound. No erythema or  swelling over wrist or elbow.   ____________________________________________   LABS (all labs ordered are listed, but only abnormal results are displayed)  Labs Reviewed - No data to display ____________________________________________  EKG   ____________________________________________  RADIOLOGY  No results found.  ____________________________________________    PROCEDURES  Procedure(s) performed:    Procedures    Medications  clindamycin (CLEOCIN) 150 MG capsule (not administered)  methylPREDNISolone sodium succinate (SOLU-MEDROL) 125 mg/2 mL injection 125 mg (125 mg Intramuscular Given 04/29/17 1105)  famotidine (PEPCID) tablet 20 mg (20 mg Oral Given 04/29/17 1107)  clindamycin (CLEOCIN) capsule 300 mg (300 mg Oral Given 04/29/17 1106)  diphenhydrAMINE (BENADRYL) injection 25 mg (25 mg Intramuscular Given 04/29/17 1106)     ____________________________________________   INITIAL IMPRESSION / ASSESSMENT AND PLAN / ED COURSE  Pertinent labs & imaging results that were available during my care of the patient were reviewed by me and considered in my medical decision making (see chart for details).  Review of the [place] CSRS was performed in accordance of the [facility] prior to dispensing any controlled drugs.   Patient's diagnosis is consistent with allergic reaction and dental caries. Vital signs and exam are reassuring. She was given Solu-Medrol, IM Benadryl, Pepcid and clindamycin in ED. Dental resources were provided. Patient will be discharged home with prescriptions for prednisone, Pepcid, clindamycin. Patient is to follow up with dentist and PCP as directed. Patient is given ED precautions to return to the ED for any worsening or new symptoms.     ____________________________________________  FINAL CLINICAL IMPRESSION(S) / ED DIAGNOSES  Final diagnoses:  Dental caries  Allergic reaction, initial encounter      NEW MEDICATIONS STARTED DURING THIS  VISIT:  Discharge Medication List as of 04/29/2017 11:21 AM    START taking these medications   Details  clindamycin (CLEOCIN) 300 MG capsule Take 1 capsule (300 mg total) by mouth 3 (three) times daily., Starting Fri 04/29/2017, Until Mon 05/09/2017, Print    famotidine (PEPCID) 20 MG tablet Take 1 tablet (20 mg total) by mouth 2 (two) times daily., Starting Fri 04/29/2017, Until Sat 04/29/2018, Print    predniSONE (DELTASONE) 10 MG tablet Take 1 tablet (10 mg total) by mouth daily., Starting Fri 04/29/2017, Print            This chart was dictated using voice recognition software/Dragon. Despite best efforts to proofread, errors can occur which can change the meaning. Any change was purely unintentional.    Enid Derry, PA-C 04/29/17 1246    Pershing Proud Myra Rude, MD 04/29/17 [phone]

## 2017-04-29 NOTE — Discharge Instructions (Signed)
OPTIONS FOR DENTAL FOLLOW UP CARE ° °[place] Department of Health and Human Services - Local Safety Net Dental Clinics °http://www.ncdhhs.gov/dph/oralhealth/services/safetynetclinics.htm °  °[facility] (336-562-3123) ° °[facility] [place] ([phone]) ° °[facility] ([phone] ext [phone]) ° °[facility] ([phone]) ° °[facility] ([phone]) °This clinic caters to the indigent population and is on a lottery system. °Location: °[facility], [address], [place] °Clinic Hours: °Wednesdays from 6pm - 9pm, patients seen by a lottery system. °For dates, call or go to [email or web address]/[facility]/[email or web address] °Services: °Cleanings, fillings and simple extractions. °Payment Options: °DENTAL WORK IS FREE OF CHARGE. Bring proof of income or support. °Best way to get seen: °Arrive at 5:15 pm - this is a lottery, NOT first come/first serve, so arriving earlier will not increase your chances of being seen. °  °  °[facility] °[phone] °Select option 1 for emergencies °  °Location: °[facility], [address], [place] °Clinic Hours: °No walk-ins accepted - call the day before to schedule an appointment. °Check in times are 9:30 am and 1:30 pm. °Services: °Simple extractions, temporary fillings, pulpectomy/pulp debridement, uncomplicated abscess drainage. °Payment Options: °PAYMENT IS DUE AT THE TIME OF SERVICE.  Fee is usually $100-200, additional surgical procedures (e.g. abscess drainage) may be extra. °Cash, checks, Visa/MasterCard accepted.  Can file Medicaid if patient is covered for dental - patient should call case worker to check. °No discount for [facility] patients. °Best way to get seen: °MUST call the day before and get onto the schedule. Can usually be seen the next 1-2 days. No walk-ins accepted. °  °  °[facility] °[phone] °   °Location: °[facility], [address], [place] °Clinic Hours: °M, W, Th, F 8am or 1:30pm, Tues 9a or 1:30 - first come/first served. °Services: °Simple extractions, temporary fillings, uncomplicated abscess drainage.  You do not need to be an [place] resident. °Payment Options: °PAYMENT IS DUE AT THE TIME OF SERVICE. °Dental insurance, otherwise sliding scale - bring proof of income or support. °Depending on income and treatment needed, cost is usually $50-200. °Best way to get seen: °Arrive early as it is first come/first served. °  °  °[facility] °[phone] °  °Location: °[address] °Clinic Hours: °Mon-Thu 8a-5p °Services: °Most basic dental services including extractions and fillings. °Payment Options: °PAYMENT IS DUE AT THE TIME OF SERVICE. °Sliding scale, up to 50% off - bring proof if income or support. °Medicaid with dental option accepted. °Best way to get seen: °Call to schedule an appointment, can usually be seen within 2 weeks OR they will try to see walk-ins - show up at 8a or 2p (you may have to wait). °  °  °[facility] °[phone] °[place] RESIDENTS ONLY °  °Location: °[facility], [address], [place], [place] [address] °Clinic Hours: By appointment only. °Monday - Thursday 8am-5pm, Friday 8am-12pm °Services: Cleanings, fillings, extractions. °Payment Options: °PAYMENT IS DUE AT THE TIME OF SERVICE. °Cash, Visa or MasterCard. Sliding scale - $30 minimum per service. °Best way to get seen: °Come in to office, complete packet and make an appointment - need proof of income °or support monies for each household member and proof of [place] residence. °Usually takes about a month to get in. °  °  °[facility] °[phone] °  °Location: °[address]   [place] °Clinic Hours: Walk-in Urgent Care Dental Services are offered Monday-Friday  mornings only. °The numbers of emergencies accepted daily is limited to the number of °providers available. °Maximum 15 - Mondays, Wednesdays & Thursdays °Maximum 10 - Tuesdays & Fridays °Services: °You do not need to be a [place] resident to be seen for a dental emergency. °Emergencies are defined as pain, swelling, abnormal bleeding, or dental trauma. Walkins will receive x-rays if needed. °NOTE: Dental cleaning is not an emergency. °Payment Options: °PAYMENT IS DUE AT THE TIME OF SERVICE. °Minimum co-pay is $40.00 for uninsured patients. °Minimum co-pay is $3.00 for Medicaid with dental coverage. °Dental Insurance is accepted and must be presented at time of visit. °Medicare does not cover dental. °Forms of payment: Cash, credit card, checks. °Best way to get seen: °If not previously registered with the clinic, walk-in dental registration begins at 7:15 am and is on a first come/first serve basis. °If previously registered with the clinic, call to make an appointment. °  °  °[facility] °[phone] °[place] RESIDENTS ONLY °  °Location: °[address], [place], [place] °Clinic Hours: °Mon-Thu 10a-2p °Services: Extractions only! °Payment Options: °FREE (donations accepted) - bring proof of income or support °Best way to get seen: °Call and schedule an appointment OR come at 8am on the 1st Monday of every month (except for holidays) when it is first come/first served. °  °  °[facility] °[phone] °  °Location: °[address], [place] °Clinic Hours: °Friday mornings °Services, Payment Options, Best way to get seen: °Call for info °

## 2017-06-25 ENCOUNTER — Observation Stay
Admission: EM | Admit: 2017-06-25 | Discharge: 2017-06-26 | Disposition: A | Discharge status: Home / Self Care | Payer: Medicaid Other | Attending: Internal Medicine | Admitting: Internal Medicine

## 2017-06-25 ENCOUNTER — Encounter: Payer: Self-pay | Admitting: Emergency Medicine

## 2017-06-25 ENCOUNTER — Emergency Department: Payer: Medicaid Other

## 2017-06-25 DIAGNOSIS — Z79891 Long term (current) use of opiate analgesic: Secondary | ICD-10-CM | POA: Insufficient documentation

## 2017-06-25 DIAGNOSIS — Z79899 Other long term (current) drug therapy: Secondary | ICD-10-CM | POA: Insufficient documentation

## 2017-06-25 DIAGNOSIS — S3210XA Unspecified fracture of sacrum, initial encounter for closed fracture: Secondary | ICD-10-CM | POA: Diagnosis not present

## 2017-06-25 DIAGNOSIS — E876 Hypokalemia: Secondary | ICD-10-CM | POA: Insufficient documentation

## 2017-06-25 DIAGNOSIS — Y92488 Other paved roadways as the place of occurrence of the external cause: Secondary | ICD-10-CM | POA: Insufficient documentation

## 2017-06-25 DIAGNOSIS — S32049A Unspecified fracture of fourth lumbar vertebra, initial encounter for closed fracture: Secondary | ICD-10-CM | POA: Diagnosis not present

## 2017-06-25 DIAGNOSIS — F17210 Nicotine dependence, cigarettes, uncomplicated: Secondary | ICD-10-CM | POA: Insufficient documentation

## 2017-06-25 DIAGNOSIS — S329XXA Fracture of unspecified parts of lumbosacral spine and pelvis, initial encounter for closed fracture: Secondary | ICD-10-CM

## 2017-06-25 DIAGNOSIS — S32009A Unspecified fracture of unspecified lumbar vertebra, initial encounter for closed fracture: Secondary | ICD-10-CM | POA: Diagnosis present

## 2017-06-25 HISTORY — DX: Other specified health status: Z78.9

## 2017-06-25 LAB — COMPREHENSIVE METABOLIC PANEL
ALT: 19 U/L (ref 14–54)
AST: 33 U/L (ref 15–41)
Albumin: 3.4 g/dL — ABNORMAL LOW (ref 3.5–5.0)
Alkaline Phosphatase: 41 U/L (ref 38–126)
Anion gap: 6 (ref 5–15)
BUN: 12 mg/dL (ref 6–20)
CO2: 25 mmol/L (ref 22–32)
Calcium: 8.2 mg/dL — ABNORMAL LOW (ref 8.9–10.3)
Chloride: 111 mmol/L (ref 101–111)
Creatinine, Ser: 0.82 mg/dL (ref 0.44–1.00)
GFR calc Af Amer: >60 mL/min (ref >60)
GFR calc non Af Amer: >60 mL/min (ref >60)
Glucose, Bld: 101 mg/dL — ABNORMAL HIGH (ref 65–99)
Potassium: 3 mmol/L — ABNORMAL LOW (ref 3.5–5.1)
Sodium: 142 mmol/L (ref 135–145)
Total Bilirubin: 0.5 mg/dL (ref 0.3–1.2)
Total Protein: 5.8 g/dL — ABNORMAL LOW (ref 6.5–8.1)

## 2017-06-25 LAB — CBC WITH DIFFERENTIAL/PLATELET
Basophils Absolute: 0.1 10*3/uL (ref 0–0.1)
Basophils Relative: 1 %
Eosinophils Absolute: 0.1 10*3/uL (ref 0–0.7)
Eosinophils Relative: 1 %
HCT: 39.8 % (ref 35.0–47.0)
Hemoglobin: 13.4 g/dL (ref 12.0–16.0)
Lymphocytes Relative: 20 %
Lymphs Abs: 3.1 10*3/uL (ref 1.0–3.6)
MCH: 27.8 pg (ref 26.0–34.0)
MCHC: 33.6 g/dL (ref 32.0–36.0)
MCV: 82.6 fL (ref 80.0–100.0)
Monocytes Absolute: 0.8 10*3/uL (ref 0.2–0.9)
Monocytes Relative: 5 %
Neutro Abs: 11.5 10*3/uL — ABNORMAL HIGH (ref 1.4–6.5)
Neutrophils Relative %: 73 %
Platelets: 253 10*3/uL (ref 150–440)
RBC: 4.82 MIL/uL (ref 3.80–5.20)
RDW: 12.7 % (ref 11.5–14.5)
WBC: 15.5 10*3/uL — ABNORMAL HIGH (ref 3.6–11.0)

## 2017-06-25 LAB — HCG, QUANTITATIVE, PREGNANCY: hCG, Beta Chain, Quant, S: <1 m[IU]/mL (ref <5)

## 2017-06-25 LAB — PROTIME-INR
INR: 1.07
Prothrombin Time: 13.9 seconds (ref 11.4–15.2)

## 2017-06-25 LAB — TYPE AND SCREEN
ABO/RH(D): A POS
Antibody Screen: NEGATIVE

## 2017-06-25 MED ORDER — FENTANYL CITRATE (PF) 100 MCG/2ML IJ SOLN
100.0000 ug | Freq: Once | INTRAMUSCULAR | Status: AC
  Administered 2017-06-25: 100 ug via INTRAVENOUS

## 2017-06-25 MED ORDER — FENTANYL CITRATE (PF) 100 MCG/2ML IJ SOLN
50.0000 ug | Freq: Once | INTRAMUSCULAR | Status: AC
  Administered 2017-06-25: 50 ug via INTRAVENOUS
  Filled 2017-06-25: qty 2

## 2017-06-25 MED ORDER — FENTANYL CITRATE (PF) 100 MCG/2ML IJ SOLN
INTRAMUSCULAR | Status: AC
  Administered 2017-06-25: 100 ug via INTRAVENOUS
  Filled 2017-06-25: qty 2

## 2017-06-25 MED ORDER — NICOTINE 7 MG/24HR TD PT24
7.0000 mg | MEDICATED_PATCH | Freq: Every day | TRANSDERMAL | Status: DC
  Filled 2017-06-25: qty 1

## 2017-06-25 MED ORDER — IOPAMIDOL (ISOVUE-300) INJECTION 61%
100.0000 mL | Freq: Once | INTRAVENOUS | Status: AC | PRN
  Administered 2017-06-25: 100 mL via INTRAVENOUS

## 2017-06-25 NOTE — ED Notes (Addendum)
Note entered on wrong pt.

## 2017-06-25 NOTE — ED Notes (Signed)
Patient transported to CT 
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
## 2017-06-25 NOTE — ED Notes (Signed)
Onalee Hua, RN notified of bed assigned

## 2017-06-25 NOTE — ED Notes (Signed)
ED Provider at bedside. 

## 2017-06-26 LAB — URINALYSIS, COMPLETE (UACMP) WITH MICROSCOPIC
Bilirubin Urine: NEGATIVE
Glucose, UA: NEGATIVE mg/dL
Hgb urine dipstick: NEGATIVE
Ketones, ur: NEGATIVE mg/dL
Leukocytes, UA: NEGATIVE
Nitrite: NEGATIVE
Protein, ur: NEGATIVE mg/dL
Specific Gravity, Urine: >1.046 — ABNORMAL HIGH (ref 1.005–1.030)
pH: 6 (ref 5.0–8.0)

## 2017-06-26 MED ORDER — POTASSIUM CHLORIDE CRYS ER 20 MEQ PO TBCR
60.0000 meq | EXTENDED_RELEASE_TABLET | Freq: Once | ORAL | Status: AC
  Administered 2017-06-26: 60 meq via ORAL
  Filled 2017-06-26: qty 3

## 2017-06-26 MED ORDER — MORPHINE SULFATE (PF) 2 MG/ML IV SOLN
2.0000 mg | INTRAVENOUS | Status: DC | PRN
  Administered 2017-06-26: 2 mg via INTRAVENOUS
  Filled 2017-06-26: qty 1

## 2017-06-26 MED ORDER — OXYCODONE HCL 5 MG PO TABS: 5.0000 mg | ORAL_TABLET | Freq: Four times a day (QID) | ORAL | 0 refills | Status: DC | PRN

## 2017-06-26 MED ORDER — ONDANSETRON HCL 4 MG/2ML IJ SOLN: 4.0000 mg | Freq: Four times a day (QID) | INTRAMUSCULAR | Status: DC | PRN

## 2017-06-26 MED ORDER — ACETAMINOPHEN 650 MG RE SUPP: 650.0000 mg | Freq: Four times a day (QID) | RECTAL | Status: DC | PRN

## 2017-06-26 MED ORDER — ONDANSETRON HCL 4 MG PO TABS: 4.0000 mg | ORAL_TABLET | Freq: Four times a day (QID) | ORAL | Status: DC | PRN

## 2017-06-26 MED ORDER — ACETAMINOPHEN 325 MG PO TABS: 650.0000 mg | ORAL_TABLET | Freq: Four times a day (QID) | ORAL | Status: DC | PRN

## 2017-06-26 MED ORDER — OXYCODONE HCL 5 MG PO TABS
5.0000 mg | ORAL_TABLET | ORAL | Status: DC | PRN
  Administered 2017-06-26 (×3): 5 mg via ORAL
  Filled 2017-06-26 (×3): qty 1

## 2017-06-26 MED ORDER — ENOXAPARIN SODIUM 40 MG/0.4ML ~~LOC~~ SOLN: 40.0000 mg | SUBCUTANEOUS | Status: DC
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
## 2017-06-26 NOTE — Progress Notes (Signed)
[name] to be D/C'd Home per MD order.  Discussed with the patient and all questions fully answered.  VSS, Skin clean, dry and intact without evidence of skin break down, no evidence of skin tears noted. IV catheter discontinued intact. Site without signs and symptoms of complications. Dressing and pressure applied.  An After Visit Summary was printed and given to the patient. Patient received prescription.  D/c education completed with patient/family including follow up instructions, medication list, d/c activities limitations if indicated, with other d/c instructions as indicated by MD - patient able to verbalize understanding, all questions fully answered.   Patient instructed to return to ED, call 911, or call MD for any changes in condition.   Patient escorted via WC, and D/C home via private auto.  [name] 06/26/2017 12:03 PM

## 2017-06-26 NOTE — Discharge Instructions (Signed)
Ice,  Ambulate with crutches.   PWB left leg. RTC 10 days, O[facility] note
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
## 2017-07-13 ENCOUNTER — Emergency Department
Admission: EM | Admit: 2017-07-13 | Discharge: 2017-07-13 | Disposition: A | Discharge status: Home / Self Care | Payer: No Typology Code available for payment source | Attending: Emergency Medicine | Admitting: Emergency Medicine

## 2017-07-13 ENCOUNTER — Encounter: Payer: Self-pay | Admitting: Emergency Medicine

## 2017-07-13 DIAGNOSIS — S161XXA Strain of muscle, fascia and tendon at neck level, initial encounter: Secondary | ICD-10-CM

## 2017-07-13 DIAGNOSIS — Y92410 Unspecified street and highway as the place of occurrence of the external cause: Secondary | ICD-10-CM | POA: Insufficient documentation

## 2017-07-13 DIAGNOSIS — S29019A Strain of muscle and tendon of unspecified wall of thorax, initial encounter: Secondary | ICD-10-CM | POA: Diagnosis not present

## 2017-07-13 DIAGNOSIS — S199XXA Unspecified injury of neck, initial encounter: Secondary | ICD-10-CM | POA: Diagnosis present

## 2017-07-13 DIAGNOSIS — Y999 Unspecified external cause status: Secondary | ICD-10-CM | POA: Insufficient documentation

## 2017-07-13 DIAGNOSIS — F17200 Nicotine dependence, unspecified, uncomplicated: Secondary | ICD-10-CM | POA: Diagnosis not present

## 2017-07-13 DIAGNOSIS — Y939 Activity, unspecified: Secondary | ICD-10-CM | POA: Insufficient documentation

## 2017-07-13 MED ORDER — NAPROXEN 500 MG PO TABS: 500.0000 mg | ORAL_TABLET | Freq: Two times a day (BID) | ORAL | 0 refills | Status: AC

## 2017-07-13 MED ORDER — CYCLOBENZAPRINE HCL 5 MG PO TABS: 5.0000 mg | ORAL_TABLET | Freq: Three times a day (TID) | ORAL | 0 refills | Status: DC | PRN

## 2017-07-13 MED ORDER — NAPROXEN 500 MG PO TABS
500.0000 mg | ORAL_TABLET | Freq: Once | ORAL | Status: AC
  Administered 2017-07-13: 500 mg via ORAL
  Filled 2017-07-13: qty 1
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
## 2017-07-13 NOTE — Discharge Instructions (Signed)
Take medication as prescribed. Return to emergency department if symptoms worsen and follow-up with PCP as needed.   ° °In addition to prescribed medication, you may use cold or heat therapy for symptom relief. °
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
## 2018-02-24 LAB — HIV ANTIBODY (ROUTINE TESTING W REFLEX): HIV Screen 4th Generation wRfx: NONREACTIVE
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
## 2020-08-09 ENCOUNTER — Encounter: Payer: Self-pay | Admitting: Emergency Medicine

## 2020-08-09 ENCOUNTER — Emergency Department
Admission: EM | Admit: 2020-08-09 | Discharge: 2020-08-09 | Disposition: A | Discharge status: Home / Self Care | Payer: Medicaid Other | Attending: Emergency Medicine | Admitting: Emergency Medicine

## 2020-08-09 DIAGNOSIS — K047 Periapical abscess without sinus: Secondary | ICD-10-CM | POA: Insufficient documentation

## 2020-08-09 DIAGNOSIS — F1590 Other stimulant use, unspecified, uncomplicated: Secondary | ICD-10-CM | POA: Insufficient documentation

## 2020-08-09 DIAGNOSIS — F17200 Nicotine dependence, unspecified, uncomplicated: Secondary | ICD-10-CM | POA: Insufficient documentation

## 2020-08-09 DIAGNOSIS — K0889 Other specified disorders of teeth and supporting structures: Secondary | ICD-10-CM | POA: Diagnosis present

## 2020-08-09 MED ORDER — LIDOCAINE VISCOUS HCL 2 % MT SOLN
15.0000 mL | Freq: Once | OROMUCOSAL | Status: AC
  Administered 2020-08-09: 15 mL via OROMUCOSAL
  Filled 2020-08-09: qty 15

## 2020-08-09 MED ORDER — IBUPROFEN 600 MG PO TABS
600.0000 mg | ORAL_TABLET | Freq: Once | ORAL | Status: AC
  Administered 2020-08-09: 600 mg via ORAL
  Filled 2020-08-09: qty 1

## 2020-08-09 MED ORDER — IBUPROFEN 600 MG PO TABS: 600.0000 mg | ORAL_TABLET | Freq: Three times a day (TID) | ORAL | 0 refills | Status: DC | PRN

## 2020-08-09 MED ORDER — OXYCODONE-ACETAMINOPHEN 5-325 MG PO TABS
1.0000 | ORAL_TABLET | Freq: Once | ORAL | Status: AC
  Administered 2020-08-09: 1 via ORAL
  Filled 2020-08-09: qty 1

## 2020-08-09 MED ORDER — OXYCODONE-ACETAMINOPHEN 5-325 MG PO TABS: 1.0000 | ORAL_TABLET | ORAL | 0 refills | Status: DC | PRN

## 2020-08-09 NOTE — Discharge Instructions (Signed)
Continue and finish antibiotic as prescribed by your dentist. You may take Ibuprofen and/or Percocet as needed for pain. Return to the ER for worsening symptoms, persistent vomiting, facial swelling or other concerns.

## 2020-08-09 NOTE — ED Provider Notes (Signed)
[facility] Emergency Department Provider Note   ____________________________________________   First MD Initiated Contact with Patient 08/09/20 0424     (approximate)  I have reviewed the triage vital signs and the nursing notes.   HISTORY  Chief Complaint Dental Pain    HPI Mary Matthews is a 29 y.o. female who presents to the ED from home with a chief complaint of nontraumatic right upper dental pain x1 week.  Patient saw her dentist who placed her on amoxicillin and referred her to an oral surgeon.  Has been taking Tylenol without relief in symptoms.  Denies fever, ear pain, facial swelling.     Past Medical History:  Diagnosis Date  . Patient denies medical problems     Patient Active Problem List   Diagnosis Date Noted  . Lumbar transverse process fracture (HCC) 06/25/2017  . Sacral fracture (HCC) 06/25/2017    Past Surgical History:  Procedure Laterality Date  . NO PAST SURGERIES      Prior to Admission medications   Medication Sig Start Date End Date Taking? Authorizing Provider  cyclobenzaprine (FLEXERIL) 5 MG tablet Take 1 tablet (5 mg total) by mouth 3 (three) times daily as needed. 07/13/17   Little, Traci M, PA-C  ibuprofen (ADVIL) 600 MG tablet Take 1 tablet (600 mg total) by mouth every 8 (eight) hours as needed. 08/09/20   Irean Hong, MD  oxyCODONE (OXY IR/ROXICODONE) 5 MG immediate release tablet Take 1 tablet (5 mg total) by mouth every 6 (six) hours as needed for moderate pain. 06/26/17   Shaune Pollack, MD  oxyCODONE-acetaminophen (PERCOCET/ROXICET) 5-325 MG tablet Take 1 tablet by mouth every 4 (four) hours as needed for severe pain. 08/09/20   Irean Hong, MD    Allergies Patient has no known allergies.  Family History  Problem Relation Age of Onset  . Obesity Mother     Social History Social History   Tobacco Use  . Smoking status: Current Every Day Smoker  . Smokeless tobacco: Never Used  Substance Use Topics    . Alcohol use: Yes  . Drug use: Yes    Types: Marijuana    Review of Systems  Constitutional: No fever/chills Eyes: No visual changes. ENT: Positive for dental pain.  No sore throat. Cardiovascular: Denies chest pain. Respiratory: Denies shortness of breath. Gastrointestinal: No abdominal pain.  No nausea, no vomiting.  No diarrhea.  No constipation. Genitourinary: Negative for dysuria. Musculoskeletal: Negative for back pain. Skin: Negative for rash. Neurological: Negative for headaches, focal weakness or numbness.   ____________________________________________   PHYSICAL EXAM:  VITAL SIGNS: ED Triage Vitals [08/09/20 0352]  Enc Vitals Group     BP 115/60     Pulse Rate 95     Resp 16     Temp 99 F (37.2 C)     Temp Source Oral     SpO2 99 %     Weight      Height      Head Circumference      Peak Flow      Pain Score      Pain Loc      Pain Edu?      Excl. in GC?     Constitutional: Alert and oriented. Well appearing and in no acute distress. Eyes: Conjunctivae are normal. PERRL. EOMI. Head: Atraumatic. Nose: No congestion/rhinnorhea. Mouth/Throat: Mucous membranes are moist.  Right upper molar with pre-existing fracture and tender to palpation with tongue blade.  No intraoral or extraoral swelling.  Scattered caries. Neck: No stridor.   Cardiovascular: Normal rate, regular rhythm. Grossly normal heart sounds.  Good peripheral circulation. Respiratory: Normal respiratory effort.  No retractions. Lungs CTAB. Gastrointestinal: Soft and nontender. No distention. No abdominal bruits. No CVA tenderness. Musculoskeletal: No lower extremity tenderness nor edema.  No joint effusions. Neurologic:  Normal speech and language. No gross focal neurologic deficits are appreciated. No gait instability. Skin:  Skin is warm, dry and intact. No rash noted. Psychiatric: Mood and affect are normal. Speech and behavior are  normal.  ____________________________________________   LABS (all labs ordered are listed, but only abnormal results are displayed)  Labs Reviewed - No data to display ____________________________________________  EKG  None ____________________________________________  RADIOLOGY I, Caedin Mogan J, personally viewed and evaluated these images (plain radiographs) as part of my medical decision making, as well as reviewing the written report by the radiologist.  ED MD interpretation: None  Official radiology report(s): No results found.  ____________________________________________   PROCEDURES  Procedure(s) performed (including Critical Care):  Procedures   ____________________________________________   INITIAL IMPRESSION / ASSESSMENT AND PLAN / ED COURSE  As part of my medical decision making, I reviewed the following data within the electronic MEDICAL RECORD NUMBER Nursing notes reviewed and incorporated, Old chart reviewed, Notes from prior ED visits and [place] Controlled Substance Database     29 year old female presenting with dentalgia, already on Amoxicillin by her dentist.  Will administer lidocaine swish and spit, NSAID, Percocet and she will follow up with her dentist as scheduled.  Strict return precautions given.  Patient verbalizes understanding and agrees with plan of care.      ____________________________________________   FINAL CLINICAL IMPRESSION(S) / ED DIAGNOSES  Final diagnoses:  Pain, dental  Dental infection     ED Discharge Orders         Ordered    ibuprofen (ADVIL) 600 MG tablet  Every 8 hours PRN        08/09/20 0429    oxyCODONE-acetaminophen (PERCOCET/ROXICET) 5-325 MG tablet  Every 4 hours PRN        08/09/20 0429          *Please note:  Mary Matthews was evaluated in Emergency Department on 08/09/2020 for the symptoms described in the history of present illness. She was evaluated in the context of the global COVID-19 pandemic,  which necessitated consideration that the patient might be at risk for infection with the SARS-CoV-2 virus that causes COVID-19. Institutional protocols and algorithms that pertain to the evaluation of patients at risk for COVID-19 are in a state of rapid change based on information released by regulatory bodies including the CDC and federal and state organizations. These policies and algorithms were followed during the patient's care in the ED.  Some ED evaluations and interventions may be delayed as a result of limited staffing during and the pandemic.*   Note:  This document was prepared using Dragon voice recognition software and may include unintentional dictation errors.   Irean Hong, MD 08/09/20 [phone]

## 2020-08-09 NOTE — ED Notes (Signed)
Pt states coming in for tooth pain for 2 weeks. Pt being discharged and signed paper copy of discharge paperwork

## 2020-08-09 NOTE — ED Triage Notes (Signed)
Pt c/o upper right dental pain x1 week. Pt denies fever or discharge.

## 2020-08-24 ENCOUNTER — Encounter: Payer: Self-pay | Admitting: Emergency Medicine

## 2020-08-24 ENCOUNTER — Emergency Department
Admission: EM | Admit: 2020-08-24 | Discharge: 2020-08-25 | Disposition: A | Discharge status: Home / Self Care | Payer: Medicaid Other | Attending: Emergency Medicine | Admitting: Emergency Medicine

## 2020-08-24 ENCOUNTER — Other Ambulatory Visit: Payer: Self-pay

## 2020-08-24 DIAGNOSIS — F17200 Nicotine dependence, unspecified, uncomplicated: Secondary | ICD-10-CM | POA: Insufficient documentation

## 2020-08-24 DIAGNOSIS — K0889 Other specified disorders of teeth and supporting structures: Secondary | ICD-10-CM | POA: Diagnosis not present

## 2020-08-24 NOTE — ED Triage Notes (Signed)
Patient states that she had 2 right upper back teeth removed this Monday. Patient states that she ran out of her pain medication but that she is still hurting.

## 2020-08-25 MED ORDER — OXYCODONE-ACETAMINOPHEN 5-325 MG PO TABS
1.0000 | ORAL_TABLET | Freq: Once | ORAL | Status: AC
  Administered 2020-08-25: 1 via ORAL
  Filled 2020-08-25: qty 1

## 2020-08-25 MED ORDER — OXYCODONE-ACETAMINOPHEN 5-325 MG PO TABS: 1.0000 | ORAL_TABLET | ORAL | 0 refills | Status: DC | PRN

## 2020-08-25 MED ORDER — LIDOCAINE VISCOUS HCL 2 % MT SOLN
15.0000 mL | Freq: Once | OROMUCOSAL | Status: AC
  Administered 2020-08-25: 15 mL via OROMUCOSAL
  Filled 2020-08-25: qty 15

## 2020-08-25 NOTE — Discharge Instructions (Addendum)
Continue taking Ibuprofen as needed for pain; Percocet as needed for more severe pain.  Contact your dentist today for further instruction regarding pain control.  Return to the ER for worsening symptoms, persistent vomiting, fever or other concerns.

## 2020-08-25 NOTE — ED Provider Notes (Signed)
[facility] Emergency Department Provider Note   ____________________________________________   First MD Initiated Contact with Patient 08/25/20 0010     (approximate)  I have reviewed the triage vital signs and the nursing notes.   HISTORY  Chief Complaint Dental Pain    HPI Mary Matthews is a 29 y.o. female who presents to the ED from home with a chief complaint of dental pain.  Patient had 2 right upper molars extracted 7 days ago.  Ran out of her hydrocodone and is taking ibuprofen but states it is not alleviating her pain.  Dentist office was closed yesterday still unable to speak with anyone.  Denies fever, swelling, breathing difficulty.     Past Medical History:  Diagnosis Date  . Patient denies medical problems     Patient Active Problem List   Diagnosis Date Noted  . Lumbar transverse process fracture (HCC) 06/25/2017  . Sacral fracture (HCC) 06/25/2017    Past Surgical History:  Procedure Laterality Date  . DENTAL SURGERY    . NO PAST SURGERIES      Prior to Admission medications   Medication Sig Start Date End Date Taking? Authorizing Provider  cyclobenzaprine (FLEXERIL) 5 MG tablet Take 1 tablet (5 mg total) by mouth 3 (three) times daily as needed. 07/13/17   Little, Traci M, PA-C  ibuprofen (ADVIL) 600 MG tablet Take 1 tablet (600 mg total) by mouth every 8 (eight) hours as needed. 08/09/20   Irean Hong, MD  oxyCODONE (OXY IR/ROXICODONE) 5 MG immediate release tablet Take 1 tablet (5 mg total) by mouth every 6 (six) hours as needed for moderate pain. 06/26/17   Shaune Pollack, MD  oxyCODONE-acetaminophen (PERCOCET/ROXICET) 5-325 MG tablet Take 1 tablet by mouth every 4 (four) hours as needed for severe pain. 08/25/20   Irean Hong, MD    Allergies Patient has no known allergies.  Family History  Problem Relation Age of Onset  . Obesity Mother     Social History Social History   Tobacco Use  . Smoking status: Current  Every Day Smoker  . Smokeless tobacco: Never Used  Substance Use Topics  . Alcohol use: Not Currently  . Drug use: Yes    Types: Marijuana    Review of Systems  Constitutional: No fever/chills Eyes: No visual changes. ENT: Positive for postoperative dental pain.  No sore throat. Cardiovascular: Denies chest pain. Respiratory: Denies shortness of breath. Gastrointestinal: No abdominal pain.  No nausea, no vomiting.  No diarrhea.  No constipation. Genitourinary: Negative for dysuria. Musculoskeletal: Negative for back pain. Skin: Negative for rash. Neurological: Negative for headaches, focal weakness or numbness.   ____________________________________________   PHYSICAL EXAM:  VITAL SIGNS: ED Triage Vitals [08/24/20 2329]  Enc Vitals Group     BP (!) 147/83     Pulse Rate 75     Resp 18     Temp 99.1 F (37.3 C)     Temp Source Oral     SpO2 100 %     Weight 183 lb (83 kg)     Height 5\' 5"  (1.651 m)     Head Circumference      Peak Flow      Pain Score 8     Pain Loc      Pain Edu?      Excl. in GC?     Constitutional: Alert and oriented. Well appearing and in mild acute distress. Eyes: Conjunctivae are normal. PERRL. EOMI. Head: Atraumatic.  Nose: No congestion/rhinnorhea. Mouth/Throat: Mucous membranes are moist.  Right jaw mildly swollen.  Postoperative site clean/dry/intact; no evidence of swelling/abscess or dry socket.   Neck: No stridor.   Cardiovascular: Normal rate, regular rhythm. Grossly normal heart sounds.  Good peripheral circulation. Respiratory: Normal respiratory effort.  No retractions. Lungs CTAB. Gastrointestinal: Soft and nontender. No distention. No abdominal bruits. No CVA tenderness. Musculoskeletal: No lower extremity tenderness nor edema.  No joint effusions. Neurologic:  Normal speech and language. No gross focal neurologic deficits are appreciated. No gait instability. Skin:  Skin is warm, dry and intact. No rash noted. Psychiatric:  Mood and affect are normal. Speech and behavior are normal.  ____________________________________________   LABS (all labs ordered are listed, but only abnormal results are displayed)  Labs Reviewed - No data to display ____________________________________________  EKG  None ____________________________________________  RADIOLOGY I, Zakiyyah Savannah J, personally viewed and evaluated these images (plain radiographs) as part of my medical decision making, as well as reviewing the written report by the radiologist.  ED MD interpretation: None  Official radiology report(s): No results found.  ____________________________________________   PROCEDURES  Procedure(s) performed (including Critical Care):  Procedures   ____________________________________________   INITIAL IMPRESSION / ASSESSMENT AND PLAN / ED COURSE  As part of my medical decision making, I reviewed the following data within the electronic MEDICAL RECORD NUMBER Nursing notes reviewed and incorporated, Notes from prior ED visits (visits for minor care issues) and [place] Controlled Substance Database     29 year old female presenting with post extraction dental pain.  Lidocaine rinse and Percocet now; limited prescription for Percocet.  Encourage patient to call her dentist this morning for further instructions to manage her pain.  Strict return precautions given.  Patient verbalizes understanding agrees with plan of care.      ____________________________________________   FINAL CLINICAL IMPRESSION(S) / ED DIAGNOSES  Final diagnoses:  Pain, dental     ED Discharge Orders         Ordered    oxyCODONE-acetaminophen (PERCOCET/ROXICET) 5-325 MG tablet  Every 4 hours PRN        08/25/20 0022          *Please note:  Mary Matthews was evaluated in Emergency Department on 08/25/2020 for the symptoms described in the history of present illness. She was evaluated in the context of the global COVID-19 pandemic, which  necessitated consideration that the patient might be at risk for infection with the SARS-CoV-2 virus that causes COVID-19. Institutional protocols and algorithms that pertain to the evaluation of patients at risk for COVID-19 are in a state of rapid change based on information released by regulatory bodies including the CDC and federal and state organizations. These policies and algorithms were followed during the patient's care in the ED.  Some ED evaluations and interventions may be delayed as a result of limited staffing during and the pandemic.*   Note:  This document was prepared using Dragon voice recognition software and may include unintentional dictation errors.   Irean Hong, MD 08/25/20 [phone]

## 2022-06-26 ENCOUNTER — Other Ambulatory Visit: Payer: Self-pay

## 2022-06-26 ENCOUNTER — Emergency Department
Admission: EM | Admit: 2022-06-26 | Discharge: 2022-06-26 | Disposition: A | Discharge status: Home / Self Care | Payer: Medicaid Other | Attending: Emergency Medicine | Admitting: Emergency Medicine

## 2022-06-26 DIAGNOSIS — K0889 Other specified disorders of teeth and supporting structures: Secondary | ICD-10-CM | POA: Diagnosis present

## 2022-06-26 DIAGNOSIS — K029 Dental caries, unspecified: Secondary | ICD-10-CM | POA: Diagnosis not present

## 2022-06-26 MED ORDER — AMOXICILLIN 875 MG PO TABS: 875.0000 mg | ORAL_TABLET | Freq: Two times a day (BID) | ORAL | 0 refills | Status: DC

## 2022-06-26 MED ORDER — IBUPROFEN 800 MG PO TABS: 800.0000 mg | ORAL_TABLET | Freq: Three times a day (TID) | ORAL | 0 refills | Status: AC | PRN

## 2022-06-26 MED ORDER — TRAMADOL HCL 50 MG PO TABS
50.0000 mg | ORAL_TABLET | Freq: Once | ORAL | Status: AC
  Administered 2022-06-26: 50 mg via ORAL
  Filled 2022-06-26: qty 1

## 2022-06-26 NOTE — ED Notes (Signed)
Pt with dental pain in upper left jaw x 2 weeks with worsening pain the past few days. Pt denies fever or throat swelling.

## 2022-06-26 NOTE — ED Triage Notes (Signed)
Pt with left upper jaw pain and swelling for several days. Pt appears in no acute distress.

## 2022-06-26 NOTE — Discharge Instructions (Signed)
Follow-up with your regular dentist if you have 1.  If not follow-up with one of the following clinics to have the tooth evaluated.  Take medication as prescribed.  May also take Tylenol by alternating it with the ibuprofen.  OPTIONS FOR DENTAL FOLLOW UP CARE  [place] Department of Health and Human Services - Local Safety Net Dental Clinics [email or web address]   [facility] [phone])  [facility] Ma [phone])  [place] [phone])  [facility] [phone])  [facility] [phone]) This clinic caters to the indigent population and is on a lottery system. Location: [employer] of Dentistry, [employer], [address] [place] Clinic Hours: Wednesdays from 6pm - 9pm, patients seen by a lottery system. For dates, call or go to [email or web address] Services: Cleanings, fillings and simple extractions. Payment Options: DENTAL WORK IS FREE OF CHARGE. Bring proof of income or support. Best way to get seen: Arrive at 5:15 pm - this is a lottery, NOT first come/first serve, so arriving earlier will not increase your chances of being seen.     [facility] [phone] Select option 1 for emergencies   Location: [facility] of Dentistry, [place], [address], [place] Clinic Hours: No walk-ins accepted - call the day before to schedule an appointment. Check in times are 9:30 am and 1:30 pm. Services: Simple extractions, temporary fillings, pulpectomy/pulp debridement, uncomplicated abscess drainage. Payment Options: PAYMENT IS DUE AT THE TIME OF SERVICE.  Fee is usually $100-200, additional surgical procedures (e.g. abscess drainage) may be extra. Cash, checks, Visa/MasterCard accepted.  Can file Medicaid if patient is covered for dental - patient should call case worker to check. No discount  for [facility] patients. Best way to get seen: MUST call the day before and get onto the schedule. Can usually be seen the next 1-2 days. No walk-ins accepted.     [facility] [phone]   Location: [facility], [address] [place] Clinic Hours: M, W, Th, F 8am or 1:30pm, Tues 9a or 1:30 - first come/first served. Services: Simple extractions, temporary fillings, uncomplicated abscess drainage.  You do not need to be an [facility] resident. Payment Options: PAYMENT IS DUE AT THE TIME OF SERVICE. Dental insurance, otherwise sliding scale - bring proof of income or support. Depending on income and treatment needed, cost is usually $50-200. Best way to get seen: Arrive early as it is first come/first served.     [facility] [phone]   Location: [address] Clinic Hours: Mon-Thu 8a-5p Services: Most basic dental services including extractions and fillings. Payment Options: PAYMENT IS DUE AT THE TIME OF SERVICE. Sliding scale, up to 50% off - bring proof if income or support. Medicaid with dental option accepted. Best way to get seen: Call to schedule an appointment, can usually be seen within 2 weeks OR they will try to see walk-ins - show up at 8a or 2p (you may have to wait).     [facility] [phone] [place] RESIDENTS ONLY   Location: [facility], [address] [place], [place] [address] Clinic Hours: By appointment only. Monday - Thursday 8am-5pm, Friday 8am-12pm Services: Cleanings, fillings, extractions. Payment Options: PAYMENT IS DUE AT THE TIME OF SERVICE. Cash, Visa or MasterCard. Sliding scale - $30 minimum per service. Best way to get seen: Come in to office, complete packet and make an appointment -  need proof of income or support monies for each household member and proof of [facility] residence. Usually  takes about a month to get in.     [facility] [phone]   Location: [address], [facility] Clinic Hours: Walk-in Urgent Care Dental Services are offered Monday-Friday mornings only. The numbers of emergencies accepted daily is limited to the number of providers available. Maximum 15 - Mondays, Wednesdays & Thursdays Maximum 10 - Tuesdays & Fridays Services: You do not need to be a [facility] resident to be seen for a dental emergency. Emergencies are defined as pain, swelling, abnormal bleeding, or dental trauma. Walkins will receive x-rays if needed. NOTE: Dental cleaning is not an emergency. Payment Options: PAYMENT IS DUE AT THE TIME OF SERVICE. Minimum co-pay is $40.00 for uninsured patients. Minimum co-pay is $3.00 for Medicaid with dental coverage. Dental Insurance is accepted and must be presented at time of visit. Medicare does not cover dental. Forms of payment: Cash, credit card, checks. Best way to get seen: If not previously registered with the clinic, walk-in dental registration begins at 7:15 am and is on a first come/first serve basis. If previously registered with the clinic, call to make an appointment.     [facility] [phone] [place] RESIDENTS ONLY   Location: [address] [place], [place] Clinic Hours: Mon-Thu 10a-2p Services: Extractions only! Payment Options: FREE (donations accepted) - bring proof of income or support Best way to get seen: Call and schedule an appointment OR come at 8am on the 1st Monday of every month (except for holidays) when it is first come/first served.     [facility] [phone]   Location: [address] [place], [place] Clinic Hours: Friday mornings Services, Payment Options, Best way to get seen: Call for info

## 2022-06-26 NOTE — ED Provider Notes (Signed)
[facility] Provider Note    Event Date/Time   First MD Initiated Contact with Patient 06/26/22 [phone]     (approximate)   History   Jaw Pain   HPI  Mary Matthews is a 31 y.o. female with no significant past medical history presents emergency department complaining of left-sided dental pain.  Patient states the left upper molar and tooth behind it are very tender.  States some swelling.  Has tried to make an appointment with a dentist but has been unsuccessful.  Denies fever or chills.  No chest pain or shortness of breath.      Physical Exam   Triage Vital Signs: ED Triage Vitals [06/26/22 0258]  Enc Vitals Group     BP (!) 117/54     Pulse Rate 78     Resp 16     Temp 98.5 F (36.9 C)     Temp Source Oral     SpO2 98 %     Weight 195 lb (88.5 kg)     Height 5\' 4"  (1.626 m)     Head Circumference      Peak Flow      Pain Score 7     Pain Loc      Pain Edu?      Excl. in GC?     Most recent vital signs: Vitals:   06/26/22 0258 06/26/22 0716  BP: (!) 117/54 (!) 114/56  Pulse: 78 76  Resp: 16 16  Temp: 98.5 F (36.9 C) 98.4 F (36.9 C)  SpO2: 98% 98%     General: Awake, no distress.   CV:  Good peripheral perfusion. regular rate and  rhythm Resp:  Normal effort. Lungs CTA Abd:  No distention.   Other:  Dental caries noted in the left upper molar, tooth is tender when touched with the tongue depressor   ED Results / Procedures / Treatments   Labs (all labs ordered are listed, but only abnormal results are displayed) Labs Reviewed - No data to display   EKG     RADIOLOGY     PROCEDURES:   Procedures   MEDICATIONS ORDERED IN ED: Medications  traMADol (ULTRAM) tablet 50 mg (has no administration in time range)     IMPRESSION / MDM / ASSESSMENT AND PLAN / ED COURSE  I reviewed the triage vital signs and the nursing notes.                              Differential diagnosis includes, but is not limited  to, dental caries, dental abscess, fractured tooth  Patient's presentation is most consistent with acute, uncomplicated illness.   Patient's exam is consistent with dental caries and dental pain.  She will be given a prescription for amoxicillin and ibuprofen.  She was given tramadol while here in the ED.  Work note.  Follow-up with dental clinics.  She was given a list of local dental clinics.  She is to return emergency department worsening.  Discharged stable condition.      FINAL CLINICAL IMPRESSION(S) / ED DIAGNOSES   Final diagnoses:  Pain due to dental caries     Rx / DC Orders   ED Discharge Orders          Ordered    amoxicillin (AMOXIL) 875 MG tablet  2 times daily,   Status:  Discontinued        06/26/22 06/28/22  ibuprofen (ADVIL) 800 MG tablet  Every 8 hours PRN        06/26/22 0721    amoxicillin (AMOXIL) 875 MG tablet  2 times daily        06/26/22 4765             Note:  This document was prepared using Dragon voice recognition software and may include unintentional dictation errors.    Faythe Ghee, PA-C 06/26/22 4650    Concha Se, MD 06/27/22 [phone]

## 2022-10-05 ENCOUNTER — Encounter
Admission: EM | Disposition: A | Discharge status: Home / Self Care | Payer: Self-pay | Source: Home / Self Care | Attending: Emergency Medicine

## 2022-10-05 ENCOUNTER — Ambulatory Visit
Admission: EM | Admit: 2022-10-05 | Discharge: 2022-10-05 | Disposition: A | Discharge status: Home / Self Care | Payer: Medicaid Other | Attending: Emergency Medicine | Admitting: Emergency Medicine

## 2022-10-05 ENCOUNTER — Other Ambulatory Visit: Payer: Self-pay

## 2022-10-05 ENCOUNTER — Encounter: Payer: Self-pay | Admitting: Emergency Medicine

## 2022-10-05 ENCOUNTER — Emergency Department
Admission: EM | Admit: 2022-10-05 | Discharge: 2022-10-05 | Disposition: A | Discharge status: Home / Self Care | Payer: Medicaid Other | Source: Home / Self Care | Attending: Emergency Medicine | Admitting: Emergency Medicine

## 2022-10-05 ENCOUNTER — Emergency Department: Payer: Medicaid Other | Admitting: Anesthesiology

## 2022-10-05 ENCOUNTER — Emergency Department: Payer: Medicaid Other

## 2022-10-05 DIAGNOSIS — N939 Abnormal uterine and vaginal bleeding, unspecified: Secondary | ICD-10-CM

## 2022-10-05 DIAGNOSIS — D72829 Elevated white blood cell count, unspecified: Secondary | ICD-10-CM | POA: Insufficient documentation

## 2022-10-05 DIAGNOSIS — O4690 Antepartum hemorrhage, unspecified, unspecified trimester: Secondary | ICD-10-CM

## 2022-10-05 DIAGNOSIS — O034 Incomplete spontaneous abortion without complication: Secondary | ICD-10-CM | POA: Diagnosis not present

## 2022-10-05 DIAGNOSIS — F17210 Nicotine dependence, cigarettes, uncomplicated: Secondary | ICD-10-CM | POA: Insufficient documentation

## 2022-10-05 DIAGNOSIS — O209 Hemorrhage in early pregnancy, unspecified: Secondary | ICD-10-CM | POA: Insufficient documentation

## 2022-10-05 DIAGNOSIS — Z3A00 Weeks of gestation of pregnancy not specified: Secondary | ICD-10-CM | POA: Insufficient documentation

## 2022-10-05 HISTORY — PX: DILATION AND EVACUATION: SHX1459

## 2022-10-05 LAB — CBC WITH DIFFERENTIAL/PLATELET
Abs Immature Granulocytes: 0.07 10*3/uL (ref 0.00–0.07)
Abs Immature Granulocytes: 0.09 10*3/uL — ABNORMAL HIGH (ref 0.00–0.07)
Basophils Absolute: 0 10*3/uL (ref 0.0–0.1)
Basophils Absolute: 0 10*3/uL (ref 0.0–0.1)
Basophils Relative: 0 %
Basophils Relative: 0 %
Eosinophils Absolute: 0.1 10*3/uL (ref 0.0–0.5)
Eosinophils Absolute: 0 10*3/uL (ref 0.0–0.5)
Eosinophils Relative: 1 %
Eosinophils Relative: 0 %
HCT: 36.9 % (ref 36.0–46.0)
HCT: 31.4 % — ABNORMAL LOW (ref 36.0–46.0)
Hemoglobin: 12 g/dL (ref 12.0–15.0)
Hemoglobin: 10.2 g/dL — ABNORMAL LOW (ref 12.0–15.0)
Immature Granulocytes: 1 %
Immature Granulocytes: 1 %
Lymphocytes Relative: 25 %
Lymphocytes Relative: 15 %
Lymphs Abs: 2.8 10*3/uL (ref 0.7–4.0)
Lymphs Abs: 2.6 10*3/uL (ref 0.7–4.0)
MCH: 27.4 pg (ref 26.0–34.0)
MCH: 27.4 pg (ref 26.0–34.0)
MCHC: 32.5 g/dL (ref 30.0–36.0)
MCHC: 32.5 g/dL (ref 30.0–36.0)
MCV: 84.2 fL (ref 80.0–100.0)
MCV: 84.4 fL (ref 80.0–100.0)
Monocytes Absolute: 0.9 10*3/uL (ref 0.1–1.0)
Monocytes Absolute: 0.9 10*3/uL (ref 0.1–1.0)
Monocytes Relative: 8 %
Monocytes Relative: 5 %
Neutro Abs: 7.2 10*3/uL (ref 1.7–7.7)
Neutro Abs: 13.4 10*3/uL — ABNORMAL HIGH (ref 1.7–7.7)
Neutrophils Relative %: 65 %
Neutrophils Relative %: 79 %
Platelets: 275 10*3/uL (ref 150–400)
Platelets: 289 10*3/uL (ref 150–400)
RBC: 4.38 MIL/uL (ref 3.87–5.11)
RBC: 3.72 MIL/uL — ABNORMAL LOW (ref 3.87–5.11)
RDW: 13.1 % (ref 11.5–15.5)
RDW: 13.2 % (ref 11.5–15.5)
WBC: 11.2 10*3/uL — ABNORMAL HIGH (ref 4.0–10.5)
WBC: 17 10*3/uL — ABNORMAL HIGH (ref 4.0–10.5)
nRBC: 0 % (ref 0.0–0.2)
nRBC: 0 % (ref 0.0–0.2)

## 2022-10-05 LAB — URINALYSIS, ROUTINE W REFLEX MICROSCOPIC
Bacteria, UA: NONE SEEN
RBC / HPF: >50 RBC/hpf — ABNORMAL HIGH (ref 0–5)
Specific Gravity, Urine: 1.028 (ref 1.005–1.030)
Squamous Epithelial / LPF: NONE SEEN (ref 0–5)
WBC, UA: NONE SEEN WBC/hpf (ref 0–5)

## 2022-10-05 LAB — COMPREHENSIVE METABOLIC PANEL
ALT: 13 U/L (ref 0–44)
AST: 18 U/L (ref 15–41)
Albumin: 3.7 g/dL (ref 3.5–5.0)
Alkaline Phosphatase: 47 U/L (ref 38–126)
Anion gap: 7 (ref 5–15)
BUN: 9 mg/dL (ref 6–20)
CO2: 22 mmol/L (ref 22–32)
Calcium: 9.1 mg/dL (ref 8.9–10.3)
Chloride: 108 mmol/L (ref 98–111)
Creatinine, Ser: 0.66 mg/dL (ref 0.44–1.00)
GFR, Estimated: >60 mL/min (ref >60)
Glucose, Bld: 133 mg/dL — ABNORMAL HIGH (ref 70–99)
Potassium: 3.9 mmol/L (ref 3.5–5.1)
Sodium: 137 mmol/L (ref 135–145)
Total Bilirubin: 0.8 mg/dL (ref 0.3–1.2)
Total Protein: 6.6 g/dL (ref 6.5–8.1)

## 2022-10-05 LAB — TYPE AND SCREEN
ABO/RH(D): A POS
Antibody Screen: NEGATIVE

## 2022-10-05 LAB — HCG, QUANTITATIVE, PREGNANCY: hCG, Beta Chain, Quant, S: 3589 m[IU]/mL — ABNORMAL HIGH (ref <5)

## 2022-10-05 LAB — POC URINE PREG, ED: Preg Test, Ur: POSITIVE — AB

## 2022-10-05 SURGERY — DILATION AND EVACUATION, UTERUS
Anesthesia: General

## 2022-10-05 MED ORDER — ACETAMINOPHEN 10 MG/ML IV SOLN
INTRAVENOUS | Status: AC
  Filled 2022-10-05: qty 100

## 2022-10-05 MED ORDER — MIDAZOLAM HCL 2 MG/2ML IJ SOLN
INTRAMUSCULAR | Status: AC
  Filled 2022-10-05: qty 2

## 2022-10-05 MED ORDER — ACETAMINOPHEN 500 MG PO TABS
1000.0000 mg | ORAL_TABLET | Freq: Once | ORAL | Status: AC
  Administered 2022-10-05: 1000 mg via ORAL
  Filled 2022-10-05: qty 2

## 2022-10-05 MED ORDER — 0.9 % SODIUM CHLORIDE (POUR BTL) OPTIME
TOPICAL | Status: DC | PRN
  Administered 2022-10-05: 150 mL

## 2022-10-05 MED ORDER — PROPOFOL 10 MG/ML IV BOLUS
INTRAVENOUS | Status: AC
  Filled 2022-10-05: qty 20

## 2022-10-05 MED ORDER — ACETAMINOPHEN 10 MG/ML IV SOLN: 1000.0000 mg | Freq: Once | INTRAVENOUS | Status: DC | PRN

## 2022-10-05 MED ORDER — DEXAMETHASONE SODIUM PHOSPHATE 10 MG/ML IJ SOLN
INTRAMUSCULAR | Status: DC | PRN
  Administered 2022-10-05: 10 mg via INTRAVENOUS

## 2022-10-05 MED ORDER — ONDANSETRON HCL 4 MG/2ML IJ SOLN: 4.0000 mg | Freq: Once | INTRAMUSCULAR | Status: DC | PRN

## 2022-10-05 MED ORDER — KETOROLAC TROMETHAMINE 30 MG/ML IJ SOLN
INTRAMUSCULAR | Status: AC
  Filled 2022-10-05: qty 1

## 2022-10-05 MED ORDER — SILVER NITRATE-POT NITRATE 75-25 % EX MISC
CUTANEOUS | Status: AC
  Filled 2022-10-05: qty 10

## 2022-10-05 MED ORDER — LIDOCAINE HCL (CARDIAC) PF 100 MG/5ML IV SOSY
PREFILLED_SYRINGE | INTRAVENOUS | Status: DC | PRN
  Administered 2022-10-05: 100 mg via INTRAVENOUS

## 2022-10-05 MED ORDER — LACTATED RINGERS IV SOLN: INTRAVENOUS | Status: DC | PRN

## 2022-10-05 MED ORDER — MIDAZOLAM HCL 2 MG/2ML IJ SOLN
INTRAMUSCULAR | Status: DC | PRN
  Administered 2022-10-05: 2 mg via INTRAVENOUS

## 2022-10-05 MED ORDER — PHENYLEPHRINE 80 MCG/ML (10ML) SYRINGE FOR IV PUSH (FOR BLOOD PRESSURE SUPPORT)
PREFILLED_SYRINGE | INTRAVENOUS | Status: DC | PRN
  Administered 2022-10-05: 80 ug via INTRAVENOUS

## 2022-10-05 MED ORDER — OXYCODONE HCL 5 MG/5ML PO SOLN: 5.0000 mg | Freq: Once | ORAL | Status: DC | PRN

## 2022-10-05 MED ORDER — FENTANYL CITRATE (PF) 100 MCG/2ML IJ SOLN
INTRAMUSCULAR | Status: DC | PRN
  Administered 2022-10-05 (×2): 25 ug via INTRAVENOUS

## 2022-10-05 MED ORDER — DOXYCYCLINE HYCLATE 100 MG IV SOLR
200.0000 mg | INTRAVENOUS | Status: AC
  Administered 2022-10-05: 200 mg via INTRAVENOUS
  Filled 2022-10-05: qty 200

## 2022-10-05 MED ORDER — FENTANYL CITRATE (PF) 100 MCG/2ML IJ SOLN
INTRAMUSCULAR | Status: AC
  Filled 2022-10-05: qty 2

## 2022-10-05 MED ORDER — KETOROLAC TROMETHAMINE 30 MG/ML IJ SOLN
INTRAMUSCULAR | Status: DC | PRN
  Administered 2022-10-05: 30 mg via INTRAVENOUS

## 2022-10-05 MED ORDER — ONDANSETRON HCL 4 MG/2ML IJ SOLN
INTRAMUSCULAR | Status: DC | PRN
  Administered 2022-10-05: 4 mg via INTRAVENOUS

## 2022-10-05 MED ORDER — OXYCODONE HCL 5 MG PO TABS
ORAL_TABLET | ORAL | Status: AC
  Filled 2022-10-05: qty 1

## 2022-10-05 MED ORDER — OXYCODONE HCL 5 MG PO TABS: 5.0000 mg | ORAL_TABLET | Freq: Once | ORAL | Status: DC | PRN

## 2022-10-05 MED ORDER — PROPOFOL 10 MG/ML IV BOLUS
INTRAVENOUS | Status: DC | PRN
  Administered 2022-10-05: 160 mg via INTRAVENOUS

## 2022-10-05 MED ORDER — FENTANYL CITRATE (PF) 100 MCG/2ML IJ SOLN: 25.0000 ug | INTRAMUSCULAR | Status: DC | PRN

## 2022-10-05 SURGICAL SUPPLY — 28 items
DRSG TELFA 3X8 NADH STRL (GAUZE/BANDAGES/DRESSINGS) IMPLANT
FILTER UTR ASPR ASSEMBLY (MISCELLANEOUS) ×1 IMPLANT
FILTER UTR ASPR SPEC (MISCELLANEOUS) ×1 IMPLANT
FLTR UTR ASPR SPEC (MISCELLANEOUS) ×1
GLOVE SURG SYN 8.0 (GLOVE) ×1 IMPLANT
GLOVE SURG SYN 8.0 PF PI (GLOVE) ×1 IMPLANT
GOWN STRL REUS W/ TWL LRG LVL3 (GOWN DISPOSABLE) ×1 IMPLANT
GOWN STRL REUS W/ TWL XL LVL3 (GOWN DISPOSABLE) ×1 IMPLANT
GOWN STRL REUS W/TWL LRG LVL3 (GOWN DISPOSABLE) ×1
GOWN STRL REUS W/TWL XL LVL3 (GOWN DISPOSABLE) ×1
KIT BERKELEY 1ST TRIMESTER 3/8 (MISCELLANEOUS) ×1 IMPLANT
KIT TURNOVER CYSTO (KITS) ×1 IMPLANT
MANIFOLD NEPTUNE II (INSTRUMENTS) ×1 IMPLANT
PACK DNC HYST (MISCELLANEOUS) ×1 IMPLANT
PAD OB MATERNITY 4.3X12.25 (PERSONAL CARE ITEMS) ×1 IMPLANT
PAD PREP 24X41 OB/GYN DISP (PERSONAL CARE ITEMS) ×1 IMPLANT
SCRUB CHG 4% DYNA-HEX 4OZ (MISCELLANEOUS) ×1 IMPLANT
SET BERKELEY SUCTION TUBING (SUCTIONS) ×1 IMPLANT
SET CYSTO W/LG BORE CLAMP LF (SET/KITS/TRAYS/PACK) IMPLANT
TOWEL OR 17X26 4PK STRL BLUE (TOWEL DISPOSABLE) ×1 IMPLANT
TRAP FLUID SMOKE EVACUATOR (MISCELLANEOUS) ×1 IMPLANT
TRAP TISSUE FILTER (MISCELLANEOUS) ×1 IMPLANT
VACURETTE 10 RIGID CVD (CANNULA) ×1 IMPLANT
VACURETTE 6 ASPIR F TIP BERK (CANNULA) ×1 IMPLANT
VACURETTE 7MM F TIP STRL (CANNULA) ×1 IMPLANT
VACURETTE 8 RIGID CVD (CANNULA) ×1 IMPLANT
VACURETTE 8MM F TIP (MISCELLANEOUS) ×1 IMPLANT
WATER STERILE IRR 500ML POUR (IV SOLUTION) ×1 IMPLANT

## 2022-10-05 NOTE — Brief Op Note (Signed)
10/05/2022  5:56 PM  PATIENT:  [name]  31 y.o. female  PRE-OPERATIVE DIAGNOSIS:  Inc AB   POST-OPERATIVE DIAGNOSIS:  Inc Ab  PROCEDURE:  Procedure(s): DILATATION AND EVACUATION (N/A)  SURGEON:  Surgeon(s) and Role:    * Veto Macqueen, Ihor Austin, MD - Primary  PHYSICIAN ASSISTANT:   ASSISTANTS: none   ANESTHESIA:   general  EBL:  75 mL , IOF 500 cc uo 75  BLOOD ADMINISTERED:none  DRAINS: none   LOCAL MEDICATIONS USED:  NONE  SPECIMEN:  Source of Specimen:  POC  DISPOSITION OF SPECIMEN:  PATHOLOGY  COUNTS:  YES  TOURNIQUET:  * No tourniquets in log *  DICTATION: .Other Dictation: Dictation Number verbal  PLAN OF CARE: Discharge to home after PACU  PATIENT DISPOSITION:  PACU - hemodynamically stable.   Delay start of Pharmacological VTE agent (>24hrs) due to surgical blood loss or risk of bleeding: not applicable

## 2022-10-05 NOTE — Consult Note (Addendum)
Consult History and Physical   SERVICE: Gynecology [facility] unassigned   Patient Name: Mary Matthews Patient MRN:   [id number]  CC: heavy bleeding , unsure dating .    HPI: Mary Matthews is a 31 y.o. No obstetric history on file. with 1 day of bleeding cramping and passing large clots , presyncopal this afternoon hemoglobin drop from 12---> 10.2 on 12 hrs  Bhcg 3589  Review of Systems: positives in bold GEN:   fevers, chills, weight changes, appetite changes, fatigue, night sweats HEENT:  HA, vision changes, hearing loss, congestion, rhinorrhea, sinus pressure, dysphagia CV:   CP, palpitations PULM:  SOB, cough GI:  abd pain, N/V/D/C GU:  dysuria, urgency, frequency, + heavy bleeding  MSK:  arthralgias, myalgias, back pain, swelling SKIN:  rashes, color changes, pallor NEURO:  numbness, weakness, tingling, seizures, dizziness, tremors PSYCH:  depression, anxiety, behavioral problems, confusion  HEME/LYMPH:  easy bruising or bleeding ENDO:  heat/cold intolerance  Past Obstetrical History: OB History   No obstetric history on file.     Past Gynecologic History: No LMP recorded. LMP beginning in Sept  [id number] Past Medical History: Past Medical History:  Diagnosis Date   Patient denies medical problems     Past Surgical History:   Past Surgical History:  Procedure Laterality Date   DENTAL SURGERY     NO PAST SURGERIES      Family History:  family history includes Obesity in her mother.  Social History:  Social History   Socioeconomic History   Marital status: Single    Spouse name: Not on file   Number of children: Not on file   Years of education: Not on file   Highest education level: Not on file  Occupational History   Not on file  Tobacco Use   Smoking status: Every Day   Smokeless tobacco: Never  Substance and Sexual Activity   Alcohol use: Not Currently   Drug use: Yes    Types: Marijuana   Sexual activity: Not on file  Other  Topics Concern   Not on file  Social History Narrative   Not on file   Social Determinants of Health   Financial Resource Strain: Not on file  Food Insecurity: Not on file  Transportation Needs: Not on file  Physical Activity: Not on file  Stress: Not on file  Social Connections: Not on file  Intimate Partner Violence: Not on file    Home Medications:  Medications reconciled in EPIC  No current facility-administered medications on file prior to encounter.   Current Outpatient Medications on File Prior to Encounter  Medication Sig Dispense Refill   amoxicillin (AMOXIL) 875 MG tablet Take 1 tablet (875 mg total) by mouth 2 (two) times daily. 20 tablet 0   cyclobenzaprine (FLEXERIL) 5 MG tablet Take 1 tablet (5 mg total) by mouth 3 (three) times daily as needed. 20 tablet 0   ibuprofen (ADVIL) 800 MG tablet Take 1 tablet (800 mg total) by mouth every 8 (eight) hours as needed. 30 tablet 0   oxyCODONE (OXY IR/ROXICODONE) 5 MG immediate release tablet Take 1 tablet (5 mg total) by mouth every 6 (six) hours as needed for moderate pain. 16 tablet 0   oxyCODONE-acetaminophen (PERCOCET/ROXICET) 5-325 MG tablet Take 1 tablet by mouth every 4 (four) hours as needed for severe pain. 8 tablet 0    Allergies:  No Known Allergies  Physical Exam:  Temp:  [98 F (36.7 C)-98.2 F (36.8 C)] 98.2 F (36.8  C) (12/05 1457) Pulse Rate:  [81-96] 96 (12/05 1457) Resp:  [16-17] 16 (12/05 1457) BP: (124-125)/(70-73) 124/73 (12/05 1457) SpO2:  [99 %-100 %] 100 % (12/05 1457) Weight:  [88.4 kg-88.5 kg] 88.4 kg (12/05 1458)   General Appearance:  Well developed, well nourished, no acute distress, alert and oriented x3 HEENT:  Normocephalic atraumatic, extraocular movements intact, moist mucous membranes Cardiovascular:  Normal S1/S2, regular rate and rhythm, no murmurs Pulmonary:  clear to auscultation, no wheezes, rales or rhonchi, symmetric air entry, good air exchange Abdomen:  Bowel sounds  present, soft, nontender, nondistended, no abnormal masses, no epigastric pain Extremities:  Full range of motion, no pedal edema, 2+ distal pulses, no tenderness Skin:  normal coloration and turgor, no rashes, no suspicious skin lesions noted  Neurologic:  Cranial nerves 2-12 grossly intact, normal muscle tone, strength 5/5 all four extremities Psychiatric:  Normal mood and affect, appropriate, no AH/VH Pelvic deferred    Labs/Studies:   CBC and Coags:  Lab Results  Component Value Date   WBC 17.0 (H) 10/05/2022   NEUTOPHILPCT 79 10/05/2022   EOSPCT 0 10/05/2022   BASOPCT 0 10/05/2022   LYMPHOPCT 15 10/05/2022   HGB 10.2 (L) 10/05/2022   HCT 31.4 (L) 10/05/2022   MCV 84.4 10/05/2022   PLT 289 10/05/2022   INR 1.07 06/25/2017   CMP:  Lab Results  Component Value Date   NA 137 10/05/2022   K 3.9 10/05/2022   CL 108 10/05/2022   CO2 22 10/05/2022   BUN 9 10/05/2022   CREATININE 0.66 10/05/2022   CREATININE 0.82 06/25/2017   PROT 6.6 10/05/2022   BILITOT 0.8 10/05/2022   ALT 13 10/05/2022   AST 18 10/05/2022   ALKPHOS 47 10/05/2022    Other Imaging: US OB LESS THAN 14 WEEKS W/ OB TRANSVAGINAL AND DOPPLER  Result Date: 10/05/2022 CLINICAL DATA:  31 year old female with vaginal bleeding, passing heavy clots. Quantitative beta hCG 3,589. EXAM: OBSTETRIC <14 WK [place] AND TRANSVAGINAL OB US DOPPLER ULTRASOUND OF OVARIES TECHNIQUE: Both transabdominal and transvaginal ultrasound examinations were performed for complete evaluation of the gestation as well as the maternal uterus, adnexal regions, and pelvic cul-de-sac. Transvaginal technique was performed to assess early pregnancy. Color and duplex Doppler ultrasound was utilized to evaluate blood flow to the ovaries. COMPARISON:  None relevant. FINDINGS: Intrauterine gestational sac: None Maternal uterus/adnexae: Endometrial thickening up to 16 mm at the body of the uterus, but greater in more heterogeneous in the lower uterine segment  (series 1 images 36 and 37) up to 26 mm there with some vascular elements. Superimposed up to 7.3 cm diameter intramural fibroid with shadowing (image 9). Myometrium elsewhere within normal limits. The left ovary appears normal measuring 2.1 x 1.4 by 1.8 cm. The right ovary appears normal measuring 2.7 x 2.0 x 2.1 cm. No pelvic free fluid. Pulsed Doppler evaluation of both ovaries demonstrates normal appearing low-resistance arterial and venous waveforms. IMPRESSION: 1. No IUP, adnexal mass, or pelvic free fluid. But heterogeneously thickened endometrium in the lower uterine segment with some vascular elements. Favor failed IUP/abortion in progress. Recommend serial quantitative beta HCG to exclude occult ectopic pregnancy. 2. No evidence of ovarian torsion. 3. Intramural 7.3 cm uterine fibroid. Electronically Signed   By: Odessa Fleming M.D.   On: 10/05/2022 05:26     Assessment / Plan:   Mary Matthews is a 32 y.o. No obstetric history on file. who presents with   1. Incomplete Ab  Acute  blood loss  Recommend Suction D+C  Risks reviewed all questions answered .  NPO since 0600 40 minutes in patient care   Thank you for the opportunity to be involved with this pt's care.

## 2022-10-05 NOTE — Anesthesia Preprocedure Evaluation (Signed)
Anesthesia Evaluation  Patient identified by MRN, date of birth, ID band Patient awake    Reviewed: Allergy & Precautions, NPO status , Patient's Chart, lab work & pertinent test results  History of Anesthesia Complications Negative for: history of anesthetic complications  Airway Mallampati: II  TM Distance: >3 FB Neck ROM: Full    Dental no notable dental hx. (+) Teeth Intact   Pulmonary neg sleep apnea, neg COPD, Current SmokerPatient did not abstain from smoking.   Pulmonary exam normal breath sounds clear to auscultation       Cardiovascular Exercise Tolerance: Good METS(-) hypertension(-) CAD and (-) Past MI negative cardio ROS (-) dysrhythmias  Rhythm:Regular Rate:Normal - Systolic murmurs    Neuro/Psych negative neurological ROS  negative psych ROS   GI/Hepatic ,neg GERD  ,,(+)     (-) substance abuse    Endo/Other  neg diabetes    Renal/GU negative Renal ROS     Musculoskeletal   Abdominal   Peds  Hematology   Anesthesia Other Findings Past Medical History: No date: Patient denies medical problems  Reproductive/Obstetrics Missed abortion                             Anesthesia Physical Anesthesia Plan  ASA: 2  Anesthesia Plan: General   Post-op Pain Management:    Induction: Intravenous  PONV Risk Score and Plan: 3 and Ondansetron, Dexamethasone and Midazolam  Airway Management Planned: LMA  Additional Equipment: None  Intra-op Plan:   Post-operative Plan: Extubation in OR  Informed Consent: I have reviewed the patients History and Physical, chart, labs and discussed the procedure including the risks, benefits and alternatives for the proposed anesthesia with the patient or authorized representative who has indicated his/her understanding and acceptance.     Dental advisory given  Plan Discussed with: CRNA and Surgeon  Anesthesia Plan Comments: (Last ate  at 6am.  Discussed risks of anesthesia with patient, including PONV, sore throat, lip/dental/eye damage. Rare risks discussed as well, such as cardiorespiratory and neurological sequelae, and allergic reactions. Discussed the role of CRNA in patient's perioperative care. Patient understands. Patient counseled on benefits of smoking cessation, and increased perioperative risks associated with continued smoking. )       Anesthesia Quick Evaluation

## 2022-10-05 NOTE — ED Notes (Signed)
Patient discharged to home per MD order. Patient in stable condition, and deemed medically cleared by ED provider for discharge. Discharge instructions reviewed with patient/family using "Teach Back"; verbalized understanding of medication education and administration, and information about follow-up care. Denies further concerns. ° °

## 2022-10-05 NOTE — Anesthesia Postprocedure Evaluation (Signed)
Anesthesia Post Note  Patient: Mary Matthews  Procedure(s) Performed: DILATATION AND EVACUATION  Patient location during evaluation: PACU Anesthesia Type: General Level of consciousness: awake and alert Pain management: pain level controlled Vital Signs Assessment: post-procedure vital signs reviewed and stable Respiratory status: spontaneous breathing, nonlabored ventilation, respiratory function stable and patient connected to nasal cannula oxygen Cardiovascular status: blood pressure returned to baseline and stable Postop Assessment: no apparent nausea or vomiting Anesthetic complications: no   No notable events documented.   Last Vitals:  Vitals:   10/05/22 1845 10/05/22 1915  BP: 125/67 119/66  Pulse: 88 94  Resp: (!) 21 16  Temp:  (!) 36.4 C  SpO2: 100% 99%    Last Pain:  Vitals:   10/05/22 1915  TempSrc:   PainSc: 0-No pain                 Corinda Gubler

## 2022-10-05 NOTE — Discharge Instructions (Addendum)
You may alternate Tylenol 1000 mg every 6 hours as needed for pain, fever and Ibuprofen 800 mg every 6-8 hours as needed for pain, fever.  Please take Ibuprofen with food.  Do not take more than 4000 mg of Tylenol (acetaminophen) in a 24 hour period.  Your ultrasound shows what looks like a miscarriage in progress but we recommend that you have your hCG level rechecked in 48 hours.  It was 3589 today.  Please return to the emergency department if you have severe abdominal pain, bleeding so heavily that you are soaking through more than a pad an hour for more than 2 straight hours, chest pain or shortness of breath, feel lightheaded like he might pass out or do pass out, fever of 100.4 or higher.

## 2022-10-05 NOTE — ED Triage Notes (Signed)
To triage with c/o lower abd pain and vaginal bleeding since last night. Bright red blood with clots present.  +at home pregnancy test, but not confirmed with provider. LMP 07/2022

## 2022-10-05 NOTE — Op Note (Unsigned)
NAME: Mary Matthews, Mary Matthews MEDICAL RECORD NO: [id number] ACCOUNT NO: [id number] DATE OF BIRTH: December 24, 1990 FACILITY: [facility] LOCATION: [facility] PHYSICIAN: Suzy Bouchard, MD  Operative Report   DATE OF PROCEDURE: 10/05/2022  PREOPERATIVE DIAGNOSIS:  Incomplete abortion.  POSTOPERATIVE DIAGNOSIS:  Incomplete abortion.  PROCEDURE:  Suction dilation and curettage/dilation and evacuation.  SURGEON:  Suzy Bouchard, MD  ASSISTANT:  None.  ANESTHESIA:  General endotracheal anesthesia.  INDICATIONS:  A 31 year old gravida 4, para 1-0-2-1, the patient with unsure last menstrual period sometime in late September.  Approximately ten weeks estimated gestational age and was seen in the Emergency Department the day of the procedure with heavy  vaginal bleeding.  Ultrasound documented no fetal pole and quantitative hCG of 3500. The patient was sent home and four hours later started having heavy bleeding and was presyncopal and returned to the Emergency Department.  Hemoglobin dropped from  12.0-10.2 in 12 hours.  DESCRIPTION OF PROCEDURE:  After adequate general endotracheal anesthesia, the patient was placed in the dorsal supine position with the legs in the candy-cane stirrups.  The patient's perineum and vagina were prepped and draped in normal sterile  fashion.  Timeout was performed.  A weighted speculum was placed in the posterior vaginal vault and a large amount of clot and then products of conception were removed with a ring forceps.  Anterior cervix was grasped with a single tooth tenaculum and a  #8 suction curette was used to evacuate large clot and products of conception.  Sharp curettage was performed with good uterine cry throughout.  Repeat suction curettage demonstrated no additional tissue or blood clots.  Good hemostasis was noted.  Of  note, the patient's bladder was drained prior to commencement of the case, yielding 75 mL concentrated urine.  There were no  complications.  ESTIMATED BLOOD LOSS:  75 mL.  URINE OUTPUT:  75 mL.  INTRAOPERATIVE FLUIDS:  500 mL.   The patient did receive 60 mg intravenous Toradol at the end of the case and she will receive 200 mg doxycycline intravenous.     SUJ D: 10/05/2022 6:07:24 pm T: 10/05/2022 8:56:00 pm  JOB: [id number]/ [id number]

## 2022-10-05 NOTE — Discharge Instructions (Signed)

## 2022-10-05 NOTE — ED Provider Notes (Signed)
[facility] Provider Note    Event Date/Time   First MD Initiated Contact with Patient 10/05/22 1455     (approximate)   History   Vaginal Bleeding   HPI  PENNEY DOMANSKI is a 31 y.o. female  G4P1 with 1 prior miscarriage presents with vaginal bleeding. Patient is in the first trimester of pregnancy. LMP was beginning of September. She had a positive pregnancy test 3 weeks ago. Started having bleeding round 9pm. She is passing clots, changing pad every 30 min to an hour. She was seen in the ED overnight around 4am, US showed heterogenous thickened endometrium in the lower uterine segment, likely reflecting IUP/abortion in progress. Patient was dc'd and advised to f/u with OB/GYN. She attempted to go to [facility] but was told they are not accepting new patients. She spoke to someone who told ehr to come back to the ED. Patient does endorse lower abdominal cramping. Also complains or near syncope and a sycnopal episode this afternoon that occurred after she stood up from wiping herself.      Past Medical History:  Diagnosis Date   Patient denies medical problems     Patient Active Problem List   Diagnosis Date Noted   Lumbar transverse process fracture (HCC) 06/25/2017   Sacral fracture (HCC) 06/25/2017     Physical Exam  Triage Vital Signs: ED Triage Vitals  Enc Vitals Group     BP      Pulse      Resp      Temp      Temp src      SpO2      Weight      Height      Head Circumference      Peak Flow      Pain Score      Pain Loc      Pain Edu?      Excl. in GC?     Most recent vital signs: Vitals:   10/05/22 1457  BP: 124/73  Pulse: 96  Resp: 16  Temp: 98.2 F (36.8 C)  SpO2: 100%     General: Awake, no distress.  CV:  Good peripheral perfusion.  Resp:  Normal effort.  Abd:  No distention. Soft, nontender throughout Neuro:             Awake, Alert, Oriented x 3  Other:     ED Results / Procedures / Treatments   Labs (all labs ordered are listed, but only abnormal results are displayed) Labs Reviewed  CBC WITH DIFFERENTIAL/PLATELET - Abnormal; Notable for the following components:      Result Value   WBC 17.0 (*)    RBC 3.72 (*)    Hemoglobin 10.2 (*)    HCT 31.4 (*)    Neutro Abs 13.4 (*)    Abs Immature Granulocytes 0.09 (*)    All other components within normal limits     EKG  Reviewed and interpreted the pelvic US performed yesterday which shows no IUP or adnexal mass    RADIOLOGY    PROCEDURES:  Critical Care performed: No  Procedures    MEDICATIONS ORDERED IN ED: Medications - No data to display   IMPRESSION / MDM / ASSESSMENT AND PLAN / ED COURSE  I reviewed the triage vital signs and the nursing notes.  Patient's presentation is most consistent with acute presentation with potential threat to life or bodily function.  Differential diagnosis includes, but is not limited to, blood loss anemia, incomplete abortion, vasovagal syncope, ectopic pregnancy   Patient is a 31 yo female who p/w ongoing vaginal bleeding. LMP was in September some time. +pregnancy test was 3 weeks ago. Patient is bleeding passing clots and is now having presyncope. She was seen in the ED overnight with US showing likely incomplete abortion. Patient tried to f/u with [facility] clinic today but they were not accepting patients and she was advised to come to the ED bc of bleeding and sycnope.   Patients VS are reassuring and she looks well. Plan to recheck Hgb to evaluation degree of blood loss. Plan to touch base with OB/GYN to ensure patient can get f/u.   Dr. Feliberto Gottron called me.  He is aware of the patient says she will likely need a D&C.  Says we can avoid pelvic exam at this time.  H&H pending.  Will place IV.   Hemoglobin is 10.2, was 1212 hrs. ago.  Also has a leukocytosis to 17.   FINAL CLINICAL IMPRESSION(S) / ED DIAGNOSES   Final diagnoses:  Vaginal  bleeding     Rx / DC Orders   ED Discharge Orders     None        Note:  This document was prepared using Dragon voice recognition software and may include unintentional dictation errors.   Georga Hacking, MD 10/05/22 [phone]

## 2022-10-05 NOTE — Transfer of Care (Signed)
Immediate Anesthesia Transfer of Care Note  Patient: Mary Matthews  Procedure(s) Performed: DILATATION AND EVACUATION  Patient Location: PACU  Anesthesia Type:General  Level of Consciousness: awake, alert , and oriented  Airway & Oxygen Therapy: Patient Spontanous Breathing and Patient connected to face mask oxygen  Post-op Assessment: Report given to RN and Post -op Vital signs reviewed and stable  Post vital signs: Reviewed and stable  Last Vitals:  Vitals Value Taken Time  BP 105/56   Temp 98   Pulse 98   Resp 32 10/05/22 1803  SpO2 100   Vitals shown include unvalidated device data.  Last Pain:  Vitals:   10/05/22 1709  TempSrc: Tympanic  PainSc: 8          Complications: No notable events documented.

## 2022-10-05 NOTE — Anesthesia Procedure Notes (Signed)
Procedure Name: LMA Insertion Date/Time: 10/05/2022 5:40 PM  Performed by: Joanette Gula, Wladyslaw Henrichs, CRNAPre-anesthesia Checklist: Patient identified, Emergency Drugs available, Suction available and Patient being monitored Patient Re-evaluated:Patient Re-evaluated prior to induction Oxygen Delivery Method: Circle system utilized Preoxygenation: Pre-oxygenation with 100% oxygen Induction Type: IV induction Ventilation: Mask ventilation without difficulty LMA: LMA inserted LMA Size: 3.0 Number of attempts: 1 Placement Confirmation: positive ETCO2 and breath sounds checked- equal and bilateral Tube secured with: Tape Dental Injury: Teeth and Oropharynx as per pre-operative assessment

## 2022-10-05 NOTE — ED Provider Notes (Signed)
[facility] Provider Note    Event Date/Time   First MD Initiated Contact with Patient 10/05/22 0404     (approximate)   History   Vaginal Bleeding   HPI  Mary Matthews is a 31 y.o. female G4, P1 with 1 previous miscarriage and 1 previous elective abortion who presents to the emergency department with vaginal bleeding in pregnancy.  States she does not have a local OB/GYN.  States bleeding started today and she is passing clots.  Having some lower abdominal cramping and back pain.  No fevers, vomiting, diarrhea, dysuria, abnormal vaginal discharge.  Patient thinks that her menstrual goal was sometime at the beginning of September but is not sure.   History provided by patient and significant other.    Past Medical History:  Diagnosis Date   Patient denies medical problems     Past Surgical History:  Procedure Laterality Date   DENTAL SURGERY     NO PAST SURGERIES      MEDICATIONS:  Prior to Admission medications   Medication Sig Start Date End Date Taking? Authorizing Provider  amoxicillin (AMOXIL) 875 MG tablet Take 1 tablet (875 mg total) by mouth 2 (two) times daily. 06/26/22   Fisher, Roselyn Bering, PA-C  cyclobenzaprine (FLEXERIL) 5 MG tablet Take 1 tablet (5 mg total) by mouth 3 (three) times daily as needed. 07/13/17   Little, Traci M, PA-C  ibuprofen (ADVIL) 800 MG tablet Take 1 tablet (800 mg total) by mouth every 8 (eight) hours as needed. 06/26/22   Fisher, Roselyn Bering, PA-C  oxyCODONE (OXY IR/ROXICODONE) 5 MG immediate release tablet Take 1 tablet (5 mg total) by mouth every 6 (six) hours as needed for moderate pain. 06/26/17   Shaune Pollack, MD  oxyCODONE-acetaminophen (PERCOCET/ROXICET) 5-325 MG tablet Take 1 tablet by mouth every 4 (four) hours as needed for severe pain. 08/25/20   Irean Hong, MD    Physical Exam   Triage Vital Signs: ED Triage Vitals  Enc Vitals Group     BP 10/05/22 0327 125/70     Pulse Rate 10/05/22 0327 81     Resp  10/05/22 0327 17     Temp 10/05/22 0327 98 F (36.7 C)     Temp Source 10/05/22 0327 Oral     SpO2 10/05/22 0327 99 %     Weight --      Height --      Head Circumference --      Peak Flow --      Pain Score 10/05/22 0332 8     Pain Loc --      Pain Edu? --      Excl. in GC? --     Most recent vital signs: Vitals:   10/05/22 0327  BP: 125/70  Pulse: 81  Resp: 17  Temp: 98 F (36.7 C)  SpO2: 99%    CONSTITUTIONAL: Alert and oriented and responds appropriately to questions. Well-appearing; well-nourished HEAD: Normocephalic, atraumatic EYES: Conjunctivae clear, pupils appear equal, sclera nonicteric ENT: normal nose; moist mucous membranes NECK: Supple, normal ROM CARD: RRR; S1 and S2 appreciated; no murmurs, no clicks, no rubs, no gallops RESP: Normal chest excursion without splinting or tachypnea; breath sounds clear and equal bilaterally; no wheezes, no rhonchi, no rales, no hypoxia or respiratory distress, speaking full sentences ABD/GI: Normal bowel sounds; non-distended; soft, non-tender, no rebound, no guarding, no peritoneal signs BACK: The back appears normal EXT: Normal ROM in all joints; no deformity noted,  no edema; no cyanosis SKIN: Normal color for age and race; warm; no rash on exposed skin NEURO: Moves all extremities equally, normal speech PSYCH: The patient's mood and manner are appropriate.   ED Results / Procedures / Treatments   LABS: (all labs ordered are listed, but only abnormal results are displayed) Labs Reviewed  CBC WITH DIFFERENTIAL/PLATELET - Abnormal; Notable for the following components:      Result Value   WBC 11.2 (*)    All other components within normal limits  COMPREHENSIVE METABOLIC PANEL - Abnormal; Notable for the following components:   Glucose, Bld 133 (*)    All other components within normal limits  URINALYSIS, ROUTINE W REFLEX MICROSCOPIC - Abnormal; Notable for the following components:   Color, Urine RED (*)     APPearance CLOUDY (*)    Glucose, UA   (*)    Value: TEST NOT REPORTED DUE TO COLOR INTERFERENCE OF URINE PIGMENT   Hgb urine dipstick   (*)    Value: TEST NOT REPORTED DUE TO COLOR INTERFERENCE OF URINE PIGMENT   Bilirubin Urine   (*)    Value: TEST NOT REPORTED DUE TO COLOR INTERFERENCE OF URINE PIGMENT   Ketones, ur   (*)    Value: TEST NOT REPORTED DUE TO COLOR INTERFERENCE OF URINE PIGMENT   Protein, ur   (*)    Value: TEST NOT REPORTED DUE TO COLOR INTERFERENCE OF URINE PIGMENT   Nitrite   (*)    Value: TEST NOT REPORTED DUE TO COLOR INTERFERENCE OF URINE PIGMENT   Leukocytes,Ua   (*)    Value: TEST NOT REPORTED DUE TO COLOR INTERFERENCE OF URINE PIGMENT   RBC / HPF >50 (*)    All other components within normal limits  HCG, QUANTITATIVE, PREGNANCY - Abnormal; Notable for the following components:   hCG, Beta Chain, Quant, S 3,589 (*)    All other components within normal limits  POC URINE PREG, ED - Abnormal; Notable for the following components:   Preg Test, Ur POSITIVE (*)    All other components within normal limits  TYPE AND SCREEN     EKG:  EKG Interpretation  Date/Time:    Ventricular Rate:    PR Interval:    QRS Duration:   QT Interval:    QTC Calculation:   R Axis:     Text Interpretation:           RADIOLOGY: My personal review and interpretation of imaging: Ultrasound concerning for incomplete miscarriage.  I have personally reviewed all radiology reports.   US OB LESS THAN 14 WEEKS W/ OB TRANSVAGINAL AND DOPPLER  Result Date: 10/05/2022 CLINICAL DATA:  31 year old female with vaginal bleeding, passing heavy clots. Quantitative beta hCG 3,589. EXAM: OBSTETRIC <14 WK [place] AND TRANSVAGINAL OB US DOPPLER ULTRASOUND OF OVARIES TECHNIQUE: Both transabdominal and transvaginal ultrasound examinations were performed for complete evaluation of the gestation as well as the maternal uterus, adnexal regions, and pelvic cul-de-sac. Transvaginal technique was  performed to assess early pregnancy. Color and duplex Doppler ultrasound was utilized to evaluate blood flow to the ovaries. COMPARISON:  None relevant. FINDINGS: Intrauterine gestational sac: None Maternal uterus/adnexae: Endometrial thickening up to 16 mm at the body of the uterus, but greater in more heterogeneous in the lower uterine segment (series 1 images 36 and 37) up to 26 mm there with some vascular elements. Superimposed up to 7.3 cm diameter intramural fibroid with shadowing (image 9). Myometrium elsewhere within normal limits.  The left ovary appears normal measuring 2.1 x 1.4 by 1.8 cm. The right ovary appears normal measuring 2.7 x 2.0 x 2.1 cm. No pelvic free fluid. Pulsed Doppler evaluation of both ovaries demonstrates normal appearing low-resistance arterial and venous waveforms. IMPRESSION: 1. No IUP, adnexal mass, or pelvic free fluid. But heterogeneously thickened endometrium in the lower uterine segment with some vascular elements. Favor failed IUP/abortion in progress. Recommend serial quantitative beta HCG to exclude occult ectopic pregnancy. 2. No evidence of ovarian torsion. 3. Intramural 7.3 cm uterine fibroid. Electronically Signed   By: Odessa Fleming M.D.   On: 10/05/2022 05:26     PROCEDURES:  Critical Care performed: No   CRITICAL CARE Performed by: Baxter Hire Ricke Kimoto   Total critical care time: 0 minutes  Critical care time was exclusive of separately billable procedures and treating other patients.  Critical care was necessary to treat or prevent imminent or life-threatening deterioration.  Critical care was time spent personally by me on the following activities: development of treatment plan with patient and/or surrogate as well as nursing, discussions with consultants, evaluation of patient's response to treatment, examination of patient, obtaining history from patient or surrogate, ordering and performing treatments and interventions, ordering and review of laboratory  studies, ordering and review of radiographic studies, pulse oximetry and re-evaluation of patient's condition.   Procedures    IMPRESSION / MDM / ASSESSMENT AND PLAN / ED COURSE  I reviewed the triage vital signs and the nursing notes.    Patient here with vaginal bleeding in pregnancy.    DIFFERENTIAL DIAGNOSIS (includes but not limited to):   Miscarriage, subchorionic hemorrhage, placental abruption, anemia   Patient's presentation is most consistent with acute presentation with potential threat to life or bodily function.   PLAN: We will obtain CBC, hCG, urinalysis, pelvic ultrasound.  Will give Tylenol for pain.  She is hemodynamically stable.  Abdominal exam is benign.   MEDICATIONS GIVEN IN ED: Medications  acetaminophen (TYLENOL) tablet 1,000 mg (1,000 mg Oral Given 10/05/22 0427)     ED COURSE: Patient's labs show slight leukocytosis which is likely reactive or due to pregnancy.  Normal hemoglobin.  Normal electrolytes, kidney function, LFTs.  Blood type a positive.  Does not need RhoGAM.  hCG is 3589 today.  Pelvic ultrasound reviewed and interpreted by myself and the radiologist and shows no IUP, adnexal mass or pelvic free fluid.  There is a thickened endometrium in the lower uterine segment with some vascular elements likely due to incomplete miscarriage.  We discussed these findings and she prefers to follow-up with OB/GYN as an outpatient with expectant management.  Recommended that her hCGs be trended to make sure they are continuing to decline.  Given outpatient OB/GYN follow-up.  Discussed bleeding return precautions.  Patient comfortable with this plan.  Recommended over-the-counter Tylenol, Motrin for pain control.   CONSULTS:  none   OUTSIDE RECORDS REVIEWED: Reviewed patient's previous orthopedic visit with Brion Aliment in September 2018.       FINAL CLINICAL IMPRESSION(S) / ED DIAGNOSES   Final diagnoses:  Vaginal bleeding in pregnancy     Rx /  DC Orders   ED Discharge Orders     None        Note:  This document was prepared using Dragon voice recognition software and may include unintentional dictation errors.   Neira Bentsen, Layla Maw, DO 10/05/22 1030

## 2022-10-05 NOTE — ED Triage Notes (Signed)
Presents with some abd cramping and vagina seen early this am  she is approx 6 weeks preg  states bleeding is not any better

## 2022-10-06 ENCOUNTER — Encounter: Payer: Self-pay | Admitting: Obstetrics and Gynecology

## 2022-10-07 LAB — SURGICAL PATHOLOGY
